# Patient Record
Sex: Male | Born: 1935 | Race: Black or African American | Hispanic: No | Marital: Single | Smoking: Never smoker
Health system: Southern US, Community
[De-identification: ages and names within clinical notes are randomized; demographics above are authoritative.]

## PROBLEM LIST (undated history)

## (undated) DIAGNOSIS — R569 Unspecified convulsions: Secondary | ICD-10-CM

## (undated) DIAGNOSIS — I639 Cerebral infarction, unspecified: Secondary | ICD-10-CM

## (undated) DIAGNOSIS — F0280 Dementia in other diseases classified elsewhere without behavioral disturbance: Secondary | ICD-10-CM

## (undated) DIAGNOSIS — N183 Chronic kidney disease, stage 3 unspecified: Secondary | ICD-10-CM

## (undated) DIAGNOSIS — N400 Enlarged prostate without lower urinary tract symptoms: Secondary | ICD-10-CM

## (undated) DIAGNOSIS — S0990XS Unspecified injury of head, sequela: Secondary | ICD-10-CM

## (undated) DIAGNOSIS — S0193XA Puncture wound without foreign body of unspecified part of head, initial encounter: Secondary | ICD-10-CM

## (undated) DIAGNOSIS — I10 Essential (primary) hypertension: Secondary | ICD-10-CM

## (undated) DIAGNOSIS — W3400XA Accidental discharge from unspecified firearms or gun, initial encounter: Secondary | ICD-10-CM

## (undated) DIAGNOSIS — D638 Anemia in other chronic diseases classified elsewhere: Secondary | ICD-10-CM

## (undated) HISTORY — DX: Cerebral infarction, unspecified: I63.9

## (undated) HISTORY — PX: NO PAST SURGERIES: SHX2092

## (undated) HISTORY — DX: Essential (primary) hypertension: I10

---

## 2005-06-23 ENCOUNTER — Ambulatory Visit (HOSPITAL_COMMUNITY): Admission: RE | Admit: 2005-06-23 | Discharge: 2005-06-23 | Payer: Self-pay | Admitting: Gastroenterology

## 2005-06-23 ENCOUNTER — Encounter (INDEPENDENT_AMBULATORY_CARE_PROVIDER_SITE_OTHER): Payer: Self-pay | Admitting: *Deleted

## 2007-06-17 ENCOUNTER — Emergency Department (HOSPITAL_COMMUNITY): Admission: EM | Admit: 2007-06-17 | Discharge: 2007-06-17 | Payer: Self-pay | Admitting: Emergency Medicine

## 2008-10-06 ENCOUNTER — Emergency Department (HOSPITAL_COMMUNITY): Admission: EM | Admit: 2008-10-06 | Discharge: 2008-10-06 | Payer: Self-pay | Admitting: Emergency Medicine

## 2008-11-18 ENCOUNTER — Encounter: Admission: RE | Admit: 2008-11-18 | Discharge: 2008-11-18 | Payer: Self-pay | Admitting: Gastroenterology

## 2011-09-06 LAB — URINALYSIS, ROUTINE W REFLEX MICROSCOPIC
Glucose, UA: NEGATIVE
Hgb urine dipstick: NEGATIVE
Protein, ur: NEGATIVE
Specific Gravity, Urine: 1.02
pH: 5.5

## 2011-09-06 LAB — COMPREHENSIVE METABOLIC PANEL
ALT: 9
BUN: 43 — ABNORMAL HIGH
CO2: 28
Creatinine, Ser: 1.96 — ABNORMAL HIGH
Sodium: 138

## 2011-09-06 LAB — DIFFERENTIAL
Basophils Absolute: 0
Eosinophils Absolute: 0.1
Lymphs Abs: 2
Monocytes Absolute: 0.6
Monocytes Relative: 11
Neutro Abs: 2.6
Neutrophils Relative %: 49

## 2011-09-06 LAB — CBC
HCT: 35.5 — ABNORMAL LOW
MCHC: 32.9
MCV: 85.2
RDW: 16.5 — ABNORMAL HIGH

## 2011-09-20 LAB — DIFFERENTIAL
Basophils Absolute: 0.1
Basophils Relative: 1
Eosinophils Absolute: 0.1
Eosinophils Relative: 1
Lymphocytes Relative: 44
Lymphs Abs: 2.4
Monocytes Relative: 8
Neutro Abs: 2.4
Neutrophils Relative %: 45

## 2011-09-20 LAB — CBC
Platelets: 166
WBC: 5.5

## 2011-09-20 LAB — BASIC METABOLIC PANEL
Chloride: 104
Sodium: 139

## 2012-01-08 ENCOUNTER — Ambulatory Visit: Payer: Medicare Other

## 2012-01-08 ENCOUNTER — Ambulatory Visit (INDEPENDENT_AMBULATORY_CARE_PROVIDER_SITE_OTHER): Payer: Medicare Other | Admitting: Family Medicine

## 2012-01-08 DIAGNOSIS — R102 Pelvic and perineal pain: Secondary | ICD-10-CM

## 2012-01-08 DIAGNOSIS — N390 Urinary tract infection, site not specified: Secondary | ICD-10-CM

## 2012-01-08 DIAGNOSIS — R509 Fever, unspecified: Secondary | ICD-10-CM

## 2012-01-08 DIAGNOSIS — R109 Unspecified abdominal pain: Secondary | ICD-10-CM

## 2012-01-08 DIAGNOSIS — I10 Essential (primary) hypertension: Secondary | ICD-10-CM | POA: Insufficient documentation

## 2012-01-08 DIAGNOSIS — F0390 Unspecified dementia without behavioral disturbance: Secondary | ICD-10-CM

## 2012-01-08 DIAGNOSIS — R32 Unspecified urinary incontinence: Secondary | ICD-10-CM

## 2012-01-08 LAB — POCT CBC
HCT, POC: 39.3 % — AB (ref 43.5–53.7)
Lymph, poc: 1.9 (ref 0.6–3.4)
MCH, POC: 26.3 pg — AB (ref 27–31.2)
MCHC: 31.6 g/dL — AB (ref 31.8–35.4)
MCV: 83.4 fL (ref 80–97)
MID (cbc): 1.3 — AB (ref 0–0.9)
POC LYMPH PERCENT: 11.4 %L (ref 10–50)
POC MID %: 7.4 %M (ref 0–12)
Platelet Count, POC: 163 10*3/uL (ref 142–424)
RBC: 4.71 M/uL (ref 4.69–6.13)

## 2012-01-08 MED ORDER — CIPROFLOXACIN HCL 500 MG PO TABS: 500.0000 mg | ORAL_TABLET | Freq: Two times a day (BID) | ORAL | Status: AC

## 2012-01-08 NOTE — Progress Notes (Signed)
  Subjective:    Patient ID: Ronnie Hood, male    DOB: [birth year], 78 y.o.   MRN: [id number]  HPI 78 yo with multiple medical problems including dementia with  Constipation (per caregiver who is not here now), duration unknown (as caretaker unavailable).  SHaking this morning.  C/O abdominal pain for about a week, today actually wanted to come to the doctor.  Appetite seems unchanged (minimal at baseline).  Unknown if fever this week.  No nausea or vomitting.  Unknown if urine decreased.  Patient denies dysuria or  Pain elsewhere other than stomach. Endorses  Dry cough.  No runny nose, sore throat.  No history of abdominal surgeries.   Patient is incontinent - wears adult diapers  Review of Systems Negative except per HPI - no more obtainable due to patient's dementia.     Objective:   Physical Exam  Constitutional: He appears well-developed and well-nourished.  Cardiovascular: Normal rate, regular rhythm, normal heart sounds and intact distal pulses.   Pulmonary/Chest: Effort normal and breath sounds normal.  Abdominal: Soft. Bowel sounds are normal. He exhibits distension. There is no tenderness. There is no rebound and no guarding.  Neurological: He is alert.    No abdominal pain on exam, but endorsed suprapubic region as location of pain when it does hurt.   [facility] reading (PRIMARY) by  Dr. Georgiana Shore:  NAD      Results for orders placed in visit on 01/08/12  POCT CBC      Component Value Range   WBC 16.9 (*) 4.6 - 10.2 (K/uL)   Lymph, poc 1.9  0.6 - 3.4    POC LYMPH PERCENT 11.4  10 - 50 (%L)   MID (cbc) 1.3 (*) 0 - 0.9    POC MID % 7.4  0 - 12 (%M)   POC Granulocyte 13.7 (*) 2 - 6.9    Granulocyte percent 81.2 (*) 37 - 80 (%G)   RBC 4.71  4.69 - 6.13 (M/uL)   Hemoglobin 12.4 (*) 14.1 - 18.1 (g/dL)   HCT, POC 40.9 (*) 81.1 - 53.7 (%)   MCV 83.4  80 - 97 (fL)   MCH, POC 26.3 (*) 27 - 31.2 (pg)   MCHC 31.6 (*) 31.8 - 35.4 (g/dL)   RDW, POC 91.4     Platelet Count, POC  163  142 - 424 (K/uL)   MPV 9.9  0 - 99.8 (fL)    Assessment & Plan:  Abd pain, low grade fevers, leukocytosis - suspect UTI.  Unable to obtain sample.  Xray normal.  Cipro 500 BID for 10 days.  If not improving in 48 hours, RTC to see me Tuesday.  If doing better, return prn.

## 2012-09-06 ENCOUNTER — Ambulatory Visit (INDEPENDENT_AMBULATORY_CARE_PROVIDER_SITE_OTHER): Payer: Medicare Other | Admitting: *Deleted

## 2012-09-06 DIAGNOSIS — Z23 Encounter for immunization: Secondary | ICD-10-CM

## 2012-10-10 ENCOUNTER — Ambulatory Visit (INDEPENDENT_AMBULATORY_CARE_PROVIDER_SITE_OTHER): Payer: Medicare Other | Admitting: Family Medicine

## 2012-10-10 ENCOUNTER — Encounter: Payer: Self-pay | Admitting: Family Medicine

## 2012-10-10 VITALS — BP 169/92 | HR 67 | Temp 98.0°F | Resp 16 | Wt 169.4 lb

## 2012-10-10 DIAGNOSIS — Z9109 Other allergy status, other than to drugs and biological substances: Secondary | ICD-10-CM

## 2012-10-10 DIAGNOSIS — J309 Allergic rhinitis, unspecified: Secondary | ICD-10-CM

## 2012-10-10 DIAGNOSIS — I10 Essential (primary) hypertension: Secondary | ICD-10-CM

## 2012-10-10 MED ORDER — TRIAMTERENE-HCTZ 37.5-25 MG PO TABS: 1.0000 | ORAL_TABLET | Freq: Every day | ORAL | Status: DC

## 2012-10-10 MED ORDER — MOMETASONE FUROATE 50 MCG/ACT NA SUSP: 2.0000 | Freq: Every day | NASAL | Status: AC

## 2012-10-10 NOTE — Patient Instructions (Addendum)
Return to clinic in one month for recheck on blood pressure

## 2012-10-10 NOTE — Progress Notes (Signed)
  Subjective:    Patient ID: Ronnie Hood, male    DOB: 1926-06-30, 78 y.o.   MRN: [id number]  HPI patient presents with complaints of increased blood pressure recently. Denies dizziness, chest pain shortness of breath. Has some occasional swelling of right lower extremity s/p CVA, this is the side effected. He goes to the [facility] for his primary care.  Patient also has complaints of nasal congestion.  Veteran.  Moved from [place] to be with family after stroke several years ago  Review of Systems   Patient states has history of 3 previous CVA's, at 78 years of age, reports no recent health problems, healthy appearing male looks younger than stated age. Current outpatient prescriptions:amLODipine (NORVASC) 10 MG tablet, Take 10 mg by mouth daily., Disp: , Rfl: ;  aspirin 325 MG EC tablet, Take 325 mg by mouth daily., Disp: , Rfl: ;  divalproex (DEPAKOTE ER) 500 MG 24 hr tablet, Take 500 mg by mouth daily., Disp: , Rfl: ;  ergocalciferol (VITAMIN D2) 50000 UNITS capsule, Take 50,000 Units by mouth once a week., Disp: , Rfl:  ferrous sulfate 325 (65 FE) MG tablet, Take 325 mg by mouth 2 (two) times daily with a meal., Disp: , Rfl: ;  Fesoterodine Fumarate (TOVIAZ) 8 MG TB24, Take 8 mg by mouth daily., Disp: , Rfl: ;  galantamine (RAZADYNE ER) 16 MG 24 hr capsule, Take 16 mg by mouth daily with breakfast., Disp: , Rfl: ;  lisinopril (PRINIVIL,ZESTRIL) 40 MG tablet, Take 40 mg by mouth daily., Disp: , Rfl:  Multiple Vitamin (MULTIVITAMIN) tablet, Take 1 tablet by mouth daily., Disp: , Rfl: ;  omeprazole (PRILOSEC) 20 MG capsule, Take 20 mg by mouth daily., Disp: , Rfl:  Objective:   Physical Exam Chest clear heart regular rate and rhythm no murmur no gallop, trace edema in right lower leg. Slightly weak grip strength right upper extremity. No right arm drift on exam, no bruit. Alert and oriented x3 seen with sister. Nasal passages swollen, pale Skin:  No rash Using walker on wheels Patient very  appropriate and oriented    Assessment & Plan:  Return in 1 month for recheck of blood pressure Use Nasonex  For nasal passages. Meds ordered this encounter  Medications  . triamterene-hydrochlorothiazide (MAXZIDE-25) 37.5-25 MG per tablet    Sig: Take 1 each (1 tablet total) by mouth daily.    Dispense:  30 tablet    Refill:  3    Needs follow up visit 12/13  . mometasone (NASONEX) 50 MCG/ACT nasal spray    Sig: Place 2 sprays into the nose daily.    Dispense:  17 g    Refill:  12

## 2012-11-05 ENCOUNTER — Ambulatory Visit (INDEPENDENT_AMBULATORY_CARE_PROVIDER_SITE_OTHER): Payer: Medicare Other | Admitting: Family Medicine

## 2012-11-05 VITALS — BP 125/65 | HR 88 | Temp 98.7°F | Resp 18 | Wt 161.0 lb

## 2012-11-05 DIAGNOSIS — I10 Essential (primary) hypertension: Secondary | ICD-10-CM

## 2012-11-05 DIAGNOSIS — Z5181 Encounter for therapeutic drug level monitoring: Secondary | ICD-10-CM

## 2012-11-05 NOTE — Patient Instructions (Addendum)
We would like Ronnie Hood's BP to be  115- 140/ 70- 95.    Today his "bottom number" is a bit lower than we need it to be.  If you notice he is running lower than 115/70 consistently we can split the triamterene/ hydrochlorothiazide medication and have him take just a 1/2 tablet daily   I will be in touch regarding Ronnie Hood's labs.  In the meantime we can continue his medications.

## 2012-11-05 NOTE — Progress Notes (Signed)
[facility] [address], [place] [address] [id number]  Date:  11/05/2012   Name:  [name]   DOB:  08-19-1926   MRN:  [id number]  PCP:  No primary provider on file.    Chief Complaint: med consult   History of Present Illness:  Ronnie Hood is a 77 y.o. very pleasant male patient who presents with the following:  He was here about a month ago to evaluate increasing BP.  Dr. Milus Glazier added maxzide to his regimen last month and he reports that his BP has been very well controlled since.   They have not noted any adverse effects such as dizziness.   He is followed by the [facility] and they check his BP at home.  His RLE swelling is also better.    Otherwise Ronnie Hood is doing well today.  They are here to make sure if current BP is ok, and also to find out if the maxzide was meant to be a permanent addition to his regimen Patient Active Problem List  Diagnosis  . Hypertension  . Allergic rhinitis    Past Medical History  Diagnosis Date  . Hypertension   . Stroke     No past surgical history on file.  History  Substance Use Topics  . Smoking status: Never Smoker   . Smokeless tobacco: Not on file  . Alcohol Use: No    No family history on file.  Allergies  Allergen Reactions  . Penicillins Rash  . Sulfa Antibiotics Rash  . Codeine Nausea Only and Other (See Comments)    Headache    Medication list has been reviewed and updated.  Current Outpatient Prescriptions on File Prior to Visit  Medication Sig Dispense Refill  . amLODipine (NORVASC) 10 MG tablet Take 10 mg by mouth daily.      Marland Kitchen aspirin 325 MG EC tablet Take 325 mg by mouth daily.      . divalproex (DEPAKOTE ER) 500 MG 24 hr tablet Take 500 mg by mouth daily.      . ergocalciferol (VITAMIN D2) 50000 UNITS capsule Take 50,000 Units by mouth once a week.      . ferrous sulfate 325 (65 FE) MG tablet Take 325 mg by mouth 2 (two) times daily with a meal.      . Fesoterodine  Fumarate (TOVIAZ) 8 MG TB24 Take 8 mg by mouth daily.      Marland Kitchen lisinopril (PRINIVIL,ZESTRIL) 40 MG tablet Take 40 mg by mouth daily.      . mometasone (NASONEX) 50 MCG/ACT nasal spray Place 2 sprays into the nose daily.  17 g  12  . Multiple Vitamin (MULTIVITAMIN) tablet Take 1 tablet by mouth daily.      Marland Kitchen omeprazole (PRILOSEC) 20 MG capsule Take 20 mg by mouth daily.      Marland Kitchen triamterene-hydrochlorothiazide (MAXZIDE-25) 37.5-25 MG per tablet Take 1 each (1 tablet total) by mouth daily.  30 tablet  3  . galantamine (RAZADYNE ER) 16 MG 24 hr capsule Take 16 mg by mouth daily with breakfast.        Review of Systems:  As per HPI- otherwise negative.   Physical Examination: Filed Vitals:   11/05/12 1140  BP: 125/65  Pulse: 88  Temp: 98.7 F (37.1 C)  Resp: 18   Filed Vitals:   11/05/12 1140  Weight: 161 lb (73.029 kg)   There is no height on file to calculate BMI. Ideal Body Weight:  GEN: WDWN, NAD, Non-toxic, A & O x 3- appears younger than stated age HEENT: Atraumatic, Normocephalic. Neck supple. No masses, No LAD. Ears and Nose: No external deformity. CV: RRR, No M/G/R. No JVD. No thrill. No extra heart sounds. PULM: CTA B, no wheezes, crackles, rhonchi. No retractions. No resp. distress. No accessory muscle use. ABD: S, NT, ND, +BS. No rebound. No HSM. EXTR: No c/c/e- no LE swelling present NEURO uses a walker due to history of CVA PSYCH: Normally interactive. Conversant. Not depressed or anxious appearing.  Calm demeanor.    Assessment and Plan: 1. HTN (hypertension)    2. Medication monitoring encounter  Basic metabolic panel   BP controlled with current medications.  Need to make sure he does not get too low- discussed with his son who is present in the room today.  Gave BP parameters.  If he is running too low can split his maxzide pill in half.   Check BMP today- Will plan further follow- up pending labs.   Abbe Amsterdam, MD

## 2012-11-06 ENCOUNTER — Encounter: Payer: Self-pay | Admitting: Family Medicine

## 2012-11-06 LAB — BASIC METABOLIC PANEL
Creat: 1.64 mg/dL — ABNORMAL HIGH (ref 0.50–1.35)
Sodium: 136 mEq/L (ref 135–145)

## 2012-11-06 NOTE — Addendum Note (Signed)
Addended by: Abbe Amsterdam C on: 11/06/2012 03:47 AM   Modules accepted: Orders

## 2013-04-07 ENCOUNTER — Other Ambulatory Visit: Payer: Self-pay | Admitting: Family Medicine

## 2013-09-30 ENCOUNTER — Ambulatory Visit (INDEPENDENT_AMBULATORY_CARE_PROVIDER_SITE_OTHER): Payer: Medicare Other | Admitting: Family Medicine

## 2013-09-30 VITALS — BP 110/58 | HR 64 | Temp 97.9°F | Resp 17 | Wt 164.0 lb

## 2013-09-30 DIAGNOSIS — I10 Essential (primary) hypertension: Secondary | ICD-10-CM

## 2013-09-30 DIAGNOSIS — N189 Chronic kidney disease, unspecified: Secondary | ICD-10-CM

## 2013-09-30 DIAGNOSIS — Z23 Encounter for immunization: Secondary | ICD-10-CM

## 2013-09-30 DIAGNOSIS — Z862 Personal history of diseases of the blood and blood-forming organs and certain disorders involving the immune mechanism: Secondary | ICD-10-CM

## 2013-09-30 DIAGNOSIS — N400 Enlarged prostate without lower urinary tract symptoms: Secondary | ICD-10-CM

## 2013-09-30 LAB — POCT CBC
Granulocyte percent: 53.5 %G (ref 37–80)
Hemoglobin: 11.8 g/dL — AB (ref 14.1–18.1)
MCH, POC: 27.5 pg (ref 27–31.2)
MCHC: 31 g/dL — AB (ref 31.8–35.4)
MID (cbc): 0.3 (ref 0–0.9)
POC LYMPH PERCENT: 38.9 %L (ref 10–50)
POC MID %: 7.6 %M (ref 0–12)
Platelet Count, POC: 159 10*3/uL (ref 142–424)
RBC: 4.29 M/uL — AB (ref 4.69–6.13)

## 2013-09-30 LAB — COMPREHENSIVE METABOLIC PANEL
ALT: 9 U/L (ref 0–53)
Albumin: 3.8 g/dL (ref 3.5–5.2)
BUN: 38 mg/dL — ABNORMAL HIGH (ref 6–23)
Chloride: 108 mEq/L (ref 96–112)
Glucose, Bld: 103 mg/dL — ABNORMAL HIGH (ref 70–99)
Potassium: 5.3 mEq/L (ref 3.5–5.3)
Total Bilirubin: 0.4 mg/dL (ref 0.3–1.2)

## 2013-09-30 NOTE — Patient Instructions (Signed)
Continue to take the same medications  Ask the [facility] hospital about getting cataracts done   Return in 6 months, sooner if problems  Make sure you drink plenty of water to keep kidneys flushed out well.

## 2013-09-30 NOTE — Progress Notes (Signed)
Subjective: 79 year old man who has been getting his medications regularly from the [facility]. he is here for a checkup. I have not seen him for a long time. He continues to live in an apartment with his sister. He has a relative who is his caregiver also. He ambulates with a walker. He can no longer walk with a cane. He has a history of old strokes. He has problems with poor vision from his right eye, has been told he has a cataract there. However they have not told him to get cataract surgery yet. It does bother him however. He is on a number of medications which he takes faithfully. He does have to get up and urinate frequently. Otherwise brief review of systems was unremarkable.  Objective: Well-developed well-nourished man in no acute distress. He is hard of hearing, however TMs are normal. Throat is clear, missing a lot of teeth. Neck was supple without nodes or thyromegaly. No carotid bruits. Chest is clear to auscultation. Heart regular without murmurs. No ankle edema.  Assessment: Hypertension, good control,  late effects of CVA Cataract History of BPH History of anemia, probably related to his chronic chronic renal insufficiency History of chronic renal insufficiency  Plan: Check a CBC and blood chemistries panel. Continue same medications. No prescriptions needed since he gets his medicines from the [place]. Advise that we recheck him in about 6 months, sooner if he has problems   Results for orders placed in visit on 09/30/13  POCT CBC      Result Value Range   WBC 4.4 (*) 4.6 - 10.2 K/uL   Lymph, poc 1.7  0.6 - 3.4   POC LYMPH PERCENT 38.9  10 - 50 %L   MID (cbc) 0.3  0 - 0.9   POC MID % 7.6  0 - 12 %M   POC Granulocyte 2.4  2 - 6.9   Granulocyte percent 53.5  37 - 80 %G   RBC 4.29 (*) 4.69 - 6.13 M/uL   Hemoglobin 11.8 (*) 14.1 - 18.1 g/dL   HCT, POC 16.1 (*) 09.6 - 53.7 %   MCV 88.8  80 - 97 fL   MCH, POC 27.5  27 - 31.2 pg   MCHC 31.0 (*) 31.8 - 35.4 g/dL   RDW,  POC 04.5     Platelet Count, POC 159  142 - 424 K/uL   MPV 10.1  0 - 99.8 fL

## 2013-10-03 ENCOUNTER — Encounter: Payer: Self-pay | Admitting: Family Medicine

## 2014-05-08 ENCOUNTER — Ambulatory Visit (INDEPENDENT_AMBULATORY_CARE_PROVIDER_SITE_OTHER): Payer: Medicare Other | Admitting: Family Medicine

## 2014-05-08 VITALS — BP 122/72 | HR 69 | Temp 97.1°F | Resp 18 | Ht 68.0 in | Wt 168.2 lb

## 2014-05-08 DIAGNOSIS — D638 Anemia in other chronic diseases classified elsewhere: Secondary | ICD-10-CM

## 2014-05-08 DIAGNOSIS — T50905A Adverse effect of unspecified drugs, medicaments and biological substances, initial encounter: Secondary | ICD-10-CM

## 2014-05-08 DIAGNOSIS — R609 Edema, unspecified: Secondary | ICD-10-CM

## 2014-05-08 DIAGNOSIS — T887XXA Unspecified adverse effect of drug or medicament, initial encounter: Secondary | ICD-10-CM

## 2014-05-08 DIAGNOSIS — N289 Disorder of kidney and ureter, unspecified: Secondary | ICD-10-CM

## 2014-05-08 LAB — POCT CBC
GRANULOCYTE PERCENT: 52.4 % (ref 37–80)
HCT, POC: 37.3 % — AB (ref 43.5–53.7)
Hemoglobin: 11.6 g/dL — AB (ref 14.1–18.1)
Lymph, poc: 1.8 (ref 0.6–3.4)
MCH, POC: 27.1 pg (ref 27–31.2)
MCHC: 31.1 g/dL — AB (ref 31.8–35.4)
MCV: 87.2 fL (ref 80–97)
MID (CBC): 0.3 (ref 0–0.9)
MPV: 9.4 fL (ref 0–99.8)
PLATELET COUNT, POC: 205 10*3/uL (ref 142–424)
POC GRANULOCYTE: 2.4 (ref 2–6.9)
POC LYMPH PERCENT: 39.9 %L (ref 10–50)
POC MID %: 7.7 % (ref 0–12)
RBC: 4.28 M/uL — AB (ref 4.69–6.13)
RDW, POC: 17.8 %
WBC: 4.5 10*3/uL — AB (ref 4.6–10.2)

## 2014-05-08 LAB — POCT URINALYSIS DIPSTICK
Bilirubin, UA: NEGATIVE
Glucose, UA: NEGATIVE
Ketones, UA: NEGATIVE
Leukocytes, UA: NEGATIVE
NITRITE UA: NEGATIVE
PH UA: 6
Protein, UA: NEGATIVE
RBC UA: NEGATIVE
SPEC GRAV UA: 1.015
UROBILINOGEN UA: 1

## 2014-05-08 LAB — POCT UA - MICROSCOPIC ONLY
CASTS, UR, LPF, POC: NEGATIVE
Crystals, Ur, HPF, POC: NEGATIVE
Mucus, UA: NEGATIVE
RBC, urine, microscopic: NEGATIVE
Yeast, UA: NEGATIVE

## 2014-05-08 NOTE — Patient Instructions (Signed)
Decrease the amlodipine to 5 mg (one half of a 10 mg) one daily  Continue to try to avoid salt  When you are just seated keep your feet elevated as much of the time as possible. When you are up and about trying to keep walking.  I will let you know the results of your labs by sometime early next week. In the event that your kidney function has improved a lot I might add another medication, but if things are similar to what they were last time we will leave things the same  When you go back to the [facility], ask if you can be seeing a urologist to check on your prostate gland. Since you're getting up to urinate frequently all night, I believe it may need more attention than just the medicines you are on.  Return in about 3 months, early September, for a recheck. Return sooner if necessary.

## 2014-05-08 NOTE — Progress Notes (Signed)
Subjective: Patient is here with problems with swelling in his feet, right more than left. He goes to the [facility] where he is prescribed most of his medications. No recent medication changes. No injuries to his legs. He does not know how his kidney function is doing. He is 80 years old and quite proud of it. No chest pains or excessive shortness of breath. No GI complaints. He does have very frequent nocturia.  Objective: No JVD. Chest clear. Heart regular without murmurs. He has 2-3+ pitting edema of the right leg, 1-2+ of the left.  Assessment: Edema Nocturia consistent with BPH Hypertension, on Norvasc, possibly a cause of the edema  Plan: Explained to them that the amlodipine can cause swelling especially in hot weather. Try cutting it down to 5 mg daily. Check blood chemistries. Urinalysis was normal today.         Results for orders placed in visit on 05/08/14  POCT CBC      Result Value Ref Range   WBC 4.5 (*) 4.6 - 10.2 K/uL   Lymph, poc 1.8  0.6 - 3.4   POC LYMPH PERCENT 39.9  10 - 50 %L   MID (cbc) 0.3  0 - 0.9   POC MID % 7.7  0 - 12 %M   POC Granulocyte 2.4  2 - 6.9   Granulocyte percent 52.4  37 - 80 %G   RBC 4.28 (*) 4.69 - 6.13 M/uL   Hemoglobin 11.6 (*) 14.1 - 18.1 g/dL   HCT, POC 70.4 (*) 88.8 - 53.7 %   MCV 87.2  80 - 97 fL   MCH, POC 27.1  27 - 31.2 pg   MCHC 31.1 (*) 31.8 - 35.4 g/dL   RDW, POC 91.6     Platelet Count, POC 205  142 - 424 K/uL   MPV 9.4  0 - 99.8 fL  POCT URINALYSIS DIPSTICK      Result Value Ref Range   Color, UA yellow     Clarity, UA clear     Glucose, UA neg     Bilirubin, UA neg     Ketones, UA neg     Spec Grav, UA 1.015     Blood, UA neg     pH, UA 6.0     Protein, UA neg     Urobilinogen, UA 1.0     Nitrite, UA neg     Leukocytes, UA Negative    POCT UA - MICROSCOPIC ONLY      Result Value Ref Range   WBC, Ur, HPF, POC 0-1     RBC, urine, microscopic neg     Bacteria, U Microscopic trace     Mucus, UA neg     Epithelial cells, urine per micros 1-3     Crystals, Ur, HPF, POC neg     Casts, Ur, LPF, POC neg     Yeast, UA neg

## 2014-05-09 LAB — COMPREHENSIVE METABOLIC PANEL
ALBUMIN: 3.7 g/dL (ref 3.5–5.2)
ALK PHOS: 40 U/L (ref 39–117)
AST: 18 U/L (ref 0–37)
BUN: 29 mg/dL — ABNORMAL HIGH (ref 6–23)
CO2: 23 mEq/L (ref 19–32)
Calcium: 9.4 mg/dL (ref 8.4–10.5)
Chloride: 106 mEq/L (ref 96–112)
Creat: 1.56 mg/dL — ABNORMAL HIGH (ref 0.50–1.35)
Glucose, Bld: 99 mg/dL (ref 70–99)
POTASSIUM: 5.1 meq/L (ref 3.5–5.3)
Sodium: 137 mEq/L (ref 135–145)
Total Bilirubin: 0.4 mg/dL (ref 0.2–1.2)
Total Protein: 7.1 g/dL (ref 6.0–8.3)

## 2014-05-10 ENCOUNTER — Encounter: Payer: Self-pay | Admitting: Family Medicine

## 2014-09-12 ENCOUNTER — Ambulatory Visit (INDEPENDENT_AMBULATORY_CARE_PROVIDER_SITE_OTHER): Payer: Medicare Other | Admitting: *Deleted

## 2014-09-12 DIAGNOSIS — Z23 Encounter for immunization: Secondary | ICD-10-CM

## 2014-10-20 ENCOUNTER — Encounter (HOSPITAL_COMMUNITY): Payer: Self-pay | Admitting: Emergency Medicine

## 2014-10-20 ENCOUNTER — Inpatient Hospital Stay (HOSPITAL_COMMUNITY)
Admission: EM | Admit: 2014-10-20 | Discharge: 2014-10-22 | DRG: 690 | Disposition: A | Discharge status: Home Health | Payer: Medicare Other | Attending: Internal Medicine | Admitting: Internal Medicine

## 2014-10-20 DIAGNOSIS — D72829 Elevated white blood cell count, unspecified: Secondary | ICD-10-CM | POA: Diagnosis present

## 2014-10-20 DIAGNOSIS — Z885 Allergy status to narcotic agent status: Secondary | ICD-10-CM | POA: Diagnosis not present

## 2014-10-20 DIAGNOSIS — Z880 Allergy status to penicillin: Secondary | ICD-10-CM | POA: Diagnosis not present

## 2014-10-20 DIAGNOSIS — D638 Anemia in other chronic diseases classified elsewhere: Secondary | ICD-10-CM | POA: Diagnosis present

## 2014-10-20 DIAGNOSIS — R262 Difficulty in walking, not elsewhere classified: Secondary | ICD-10-CM

## 2014-10-20 DIAGNOSIS — Z882 Allergy status to sulfonamides status: Secondary | ICD-10-CM | POA: Diagnosis not present

## 2014-10-20 DIAGNOSIS — F0390 Unspecified dementia without behavioral disturbance: Secondary | ICD-10-CM | POA: Diagnosis present

## 2014-10-20 DIAGNOSIS — I10 Essential (primary) hypertension: Secondary | ICD-10-CM | POA: Diagnosis present

## 2014-10-20 DIAGNOSIS — Z8673 Personal history of transient ischemic attack (TIA), and cerebral infarction without residual deficits: Secondary | ICD-10-CM | POA: Diagnosis not present

## 2014-10-20 DIAGNOSIS — D696 Thrombocytopenia, unspecified: Secondary | ICD-10-CM | POA: Diagnosis present

## 2014-10-20 DIAGNOSIS — N390 Urinary tract infection, site not specified: Principal | ICD-10-CM | POA: Diagnosis present

## 2014-10-20 DIAGNOSIS — N183 Chronic kidney disease, stage 3 unspecified: Secondary | ICD-10-CM | POA: Diagnosis present

## 2014-10-20 DIAGNOSIS — N400 Enlarged prostate without lower urinary tract symptoms: Secondary | ICD-10-CM | POA: Diagnosis present

## 2014-10-20 DIAGNOSIS — Z7982 Long term (current) use of aspirin: Secondary | ICD-10-CM

## 2014-10-20 DIAGNOSIS — R531 Weakness: Secondary | ICD-10-CM

## 2014-10-20 DIAGNOSIS — I129 Hypertensive chronic kidney disease with stage 1 through stage 4 chronic kidney disease, or unspecified chronic kidney disease: Secondary | ICD-10-CM | POA: Diagnosis present

## 2014-10-20 DIAGNOSIS — J309 Allergic rhinitis, unspecified: Secondary | ICD-10-CM | POA: Diagnosis present

## 2014-10-20 LAB — BASIC METABOLIC PANEL
ANION GAP: 14 (ref 5–15)
BUN: 43 mg/dL — ABNORMAL HIGH (ref 6–23)
CO2: 20 meq/L (ref 19–32)
CREATININE: 1.82 mg/dL — AB (ref 0.50–1.35)
Calcium: 9.3 mg/dL (ref 8.4–10.5)
Chloride: 100 mEq/L (ref 96–112)
GFR calc Af Amer: 37 mL/min — ABNORMAL LOW (ref >90)
GFR, EST NON AFRICAN AMERICAN: 32 mL/min — AB (ref >90)
Glucose, Bld: 118 mg/dL — ABNORMAL HIGH (ref 70–99)
Potassium: 4.3 mEq/L (ref 3.7–5.3)
SODIUM: 134 meq/L — AB (ref 137–147)

## 2014-10-20 LAB — CBC
HCT: 29.1 % — ABNORMAL LOW (ref 39.0–52.0)
Hemoglobin: 9.6 g/dL — ABNORMAL LOW (ref 13.0–17.0)
MCH: 27.4 pg (ref 26.0–34.0)
MCHC: 33 g/dL (ref 30.0–36.0)
MCV: 82.9 fL (ref 78.0–100.0)
PLATELETS: 132 10*3/uL — AB (ref 150–400)
RBC: 3.51 MIL/uL — AB (ref 4.22–5.81)
RDW: 15.9 % — ABNORMAL HIGH (ref 11.5–15.5)
WBC: 14.9 10*3/uL — ABNORMAL HIGH (ref 4.0–10.5)

## 2014-10-20 LAB — URINE MICROSCOPIC-ADD ON

## 2014-10-20 LAB — URINALYSIS, ROUTINE W REFLEX MICROSCOPIC
BILIRUBIN URINE: NEGATIVE
Glucose, UA: NEGATIVE mg/dL
KETONES UR: NEGATIVE mg/dL
Nitrite: POSITIVE — AB
PROTEIN: NEGATIVE mg/dL
Specific Gravity, Urine: 1.019 (ref 1.005–1.030)
UROBILINOGEN UA: 1 mg/dL (ref 0.0–1.0)
pH: 5.5 (ref 5.0–8.0)

## 2014-10-20 LAB — I-STAT TROPONIN, ED: TROPONIN I, POC: 0 ng/mL (ref 0.00–0.08)

## 2014-10-20 LAB — VALPROIC ACID LEVEL: Valproic Acid Lvl: 51.3 ug/mL (ref 50.0–100.0)

## 2014-10-20 MED ORDER — ACETAMINOPHEN 325 MG PO TABS: 650.0000 mg | ORAL_TABLET | Freq: Four times a day (QID) | ORAL | Status: DC | PRN

## 2014-10-20 MED ORDER — TERAZOSIN HCL 2 MG PO CAPS
2.0000 mg | ORAL_CAPSULE | Freq: Every day | ORAL | Status: DC
  Administered 2014-10-20 – 2014-10-21 (×2): 2 mg via ORAL
  Filled 2014-10-20 (×3): qty 1

## 2014-10-20 MED ORDER — ONDANSETRON HCL 4 MG PO TABS: 4.0000 mg | ORAL_TABLET | Freq: Four times a day (QID) | ORAL | Status: DC | PRN

## 2014-10-20 MED ORDER — LORATADINE 10 MG PO TABS
10.0000 mg | ORAL_TABLET | Freq: Every day | ORAL | Status: DC
  Administered 2014-10-20 – 2014-10-22 (×3): 10 mg via ORAL
  Filled 2014-10-20 (×3): qty 1

## 2014-10-20 MED ORDER — PANTOPRAZOLE SODIUM 40 MG PO TBEC
40.0000 mg | DELAYED_RELEASE_TABLET | Freq: Every day | ORAL | Status: DC
  Administered 2014-10-20 – 2014-10-22 (×3): 40 mg via ORAL
  Filled 2014-10-20 (×3): qty 1

## 2014-10-20 MED ORDER — ACETAMINOPHEN 650 MG RE SUPP: 650.0000 mg | Freq: Four times a day (QID) | RECTAL | Status: DC | PRN

## 2014-10-20 MED ORDER — LISINOPRIL 40 MG PO TABS
40.0000 mg | ORAL_TABLET | Freq: Every day | ORAL | Status: DC
  Administered 2014-10-20 – 2014-10-21 (×2): 40 mg via ORAL
  Filled 2014-10-20 (×4): qty 1

## 2014-10-20 MED ORDER — FERROUS SULFATE 325 (65 FE) MG PO TABS
325.0000 mg | ORAL_TABLET | Freq: Two times a day (BID) | ORAL | Status: DC
  Administered 2014-10-20 – 2014-10-22 (×4): 325 mg via ORAL
  Filled 2014-10-20 (×6): qty 1

## 2014-10-20 MED ORDER — LEVOFLOXACIN IN D5W 750 MG/150ML IV SOLN
750.0000 mg | INTRAVENOUS | Status: DC
  Filled 2014-10-20: qty 150

## 2014-10-20 MED ORDER — ONE-DAILY MULTI VITAMINS PO TABS: 1.0000 | ORAL_TABLET | Freq: Every day | ORAL | Status: DC

## 2014-10-20 MED ORDER — LEVOFLOXACIN IN D5W 750 MG/150ML IV SOLN
750.0000 mg | Freq: Once | INTRAVENOUS | Status: AC
  Administered 2014-10-20: 750 mg via INTRAVENOUS
  Filled 2014-10-20: qty 150

## 2014-10-20 MED ORDER — DIVALPROEX SODIUM ER 500 MG PO TB24
500.0000 mg | ORAL_TABLET | Freq: Every day | ORAL | Status: DC
Start: 2014-10-21 — End: 2014-10-22
  Administered 2014-10-21 – 2014-10-22 (×2): 500 mg via ORAL
  Filled 2014-10-20 (×3): qty 1

## 2014-10-20 MED ORDER — SODIUM CHLORIDE 0.9 % IV SOLN
INTRAVENOUS | Status: DC
  Administered 2014-10-20: 16:00:00 via INTRAVENOUS

## 2014-10-20 MED ORDER — GALANTAMINE HYDROBROMIDE ER 8 MG PO CP24
16.0000 mg | ORAL_CAPSULE | Freq: Every day | ORAL | Status: DC
  Filled 2014-10-20: qty 2

## 2014-10-20 MED ORDER — AMLODIPINE BESYLATE 10 MG PO TABS
10.0000 mg | ORAL_TABLET | Freq: Every day | ORAL | Status: DC
  Administered 2014-10-20 – 2014-10-21 (×2): 10 mg via ORAL
  Filled 2014-10-20 (×3): qty 1

## 2014-10-20 MED ORDER — ONDANSETRON HCL 4 MG/2ML IJ SOLN: 4.0000 mg | Freq: Four times a day (QID) | INTRAMUSCULAR | Status: DC | PRN

## 2014-10-20 MED ORDER — SODIUM CHLORIDE 0.9 % IV SOLN
INTRAVENOUS | Status: DC
  Administered 2014-10-21: 10:00:00 via INTRAVENOUS

## 2014-10-20 MED ORDER — ERGOCALCIFEROL 1.25 MG (50000 UT) PO CAPS: 50000.0000 [IU] | ORAL_CAPSULE | ORAL | Status: DC

## 2014-10-20 MED ORDER — ASPIRIN EC 325 MG PO TBEC
325.0000 mg | DELAYED_RELEASE_TABLET | Freq: Every day | ORAL | Status: DC
  Administered 2014-10-21 – 2014-10-22 (×2): 325 mg via ORAL
  Filled 2014-10-20 (×2): qty 1

## 2014-10-20 MED ORDER — FLUTICASONE PROPIONATE 50 MCG/ACT NA SUSP
1.0000 | Freq: Every day | NASAL | Status: DC
  Administered 2014-10-20 – 2014-10-22 (×3): 1 via NASAL
  Filled 2014-10-20: qty 16

## 2014-10-20 MED ORDER — FESOTERODINE FUMARATE ER 8 MG PO TB24: 8.0000 mg | ORAL_TABLET | Freq: Every day | ORAL | Status: DC

## 2014-10-20 NOTE — Progress Notes (Signed)
  CARE MANAGEMENT ED NOTE 10/20/2014  Patient:  Josie SaundersCHAVIS,Sosaia H   Account Number:  [id number]  Date Initiated:  10/20/2014  Documentation initiated by:  Radford PaxFERRERO,Khambrel Amsden  Subjective/Objective Assessment:   Patient presents to Ed with unable to move arms and legs, increased weakness     Subjective/Objective Assessment Detail:   Patient with pmhx of HTN, stroke.  this admission WBX 14.9, Hmg 9.6     Action/Plan:   Action/Plan Detail:   Anticipated DC Date:       Status Recommendation to Physician:   Result of Recommendation:    Other ED Services  Consult Working Plan    DC Planning Services  Other  PCP issues    Choice offered to / List presented to:            Status of service:  Completed, signed off  ED Comments:   ED Comments Detail:  EDCM spoke to patient and his brother at bedside. Patient's brother reports the patient lives at home with his sister Thurston Holenne.  Patient's brother reports the patient has a walker, shower chair and wheelchair at home.  Patient requires assistance  with ADL's per patient's brother. Patient's brother reports the patient usually goes to [facility] [address] for his medical needs.  Patient's brother reports the patient receives assistance at home with ADL's about three days a week but cannot remember the name of the agency. Patient's brother also cannot remember the name of the [facility] patient is associated with.  [facility]'s sister Thurston Holenne [phone] who may have this inofrmation.  EDCM attempted to call patient's sister Thurston Holenne without success.  No further EDCM needs at this time.

## 2014-10-20 NOTE — ED Notes (Signed)
Pt went to bed saturday, states Sunday morning pt couldn't get up or move. Pt states he thought he had a stroke but wife states he was able to move arms and legs. Pt normally able to walk with walker but unable to now.

## 2014-10-20 NOTE — Plan of Care (Signed)
Problem: Phase I Progression Outcomes Goal: Pain controlled with appropriate interventions Outcome: Completed/Met Date Met:  10/20/14     

## 2014-10-20 NOTE — Progress Notes (Signed)
ANTIBIOTIC CONSULT NOTE - INITIAL  Pharmacy Consult for Levaquin Indication: UTI  Allergies  Allergen Reactions  . Penicillins Rash  . Sulfa Antibiotics Rash  . Codeine Nausea Only and Other (See Comments)    Headache    Patient Measurements:    Vital Signs: Temp: 98.2 F (36.8 C) (11/16 1325) Temp Source: Oral (11/16 1325) BP: 149/66 mmHg (11/16 1540) Pulse Rate: 68 (11/16 1540) Intake/Output from previous day:   Intake/Output from this shift:    Labs:  Recent Labs  10/20/14 1412  WBC 14.9*  HGB 9.6*  PLT 132*  CREATININE 1.82*   CrCl cannot be calculated (Unknown ideal weight.). No results for input(s): VANCOTROUGH, VANCOPEAK, VANCORANDOM, GENTTROUGH, GENTPEAK, GENTRANDOM, TOBRATROUGH, TOBRAPEAK, TOBRARND, AMIKACINPEAK, AMIKACINTROU, AMIKACIN in the last 72 hours.   Microbiology: No results found for this or any previous visit (from the past 720 hour(s)).  Medical History: Past Medical History  Diagnosis Date  . Hypertension   . Stroke     Medications:  Scheduled:   Infusions:  . sodium chloride 125 mL/hr at 10/20/14 1623  . levofloxacin (LEVAQUIN) IV 750 mg (10/20/14 1623)   PRN:   Assessment: 80 yo male brought to ED with generalized weakness. Pharmacy is consulted to dose levaquin for presumed UTI.  Goal of Therapy:  Dose appropriate for renal function  Plan:   Levaquin 750mg  IV q48h for CrCl < 50 ml/min Follow up renal function & cultures, clinical course  Loralee PacasErin Maryl Blalock, PharmD, BCPS Pager: [phone] 10/20/2014,4:42 PM

## 2014-10-20 NOTE — ED Provider Notes (Signed)
CSN: [id number]     Arrival date & time 10/20/14  1313 History   First MD Initiated Contact with Patient 10/20/14 1506     Chief Complaint  Patient presents with  . Weakness     (Consider location/radiation/quality/duration/timing/severity/associated sxs/prior Treatment) HPI  The patient's wife reports that 2 days ago he had generalized weakness and had difficulty getting out of bed. She reports that he thought maybe he had had a stroke, however he was able to move his arms and legs and she didn't think he had a stroke. . So she waited until yesterday and the symptoms seemed about the same. He was able to eat however he was shaky and had a lot of difficulty using his walker and getting around at his baseline function. Today the symptoms were no better. There has not been fever, no vomiting, there was loss of control of stool today but she denies that was diarrheal. The patient reports that he just aches all over. She reports as common for him and he does not know how to localize pain. He has not had cough or respiratory difficulty.  Past Medical History  Diagnosis Date  . Hypertension   . Stroke    History reviewed. No pertinent past surgical history. No family history on file. History  Substance Use Topics  . Smoking status: Never Smoker   . Smokeless tobacco: Not on file  . Alcohol Use: No    Review of Systems 10 Systems reviewed and are negative for acute change except as noted in the HPI.    Allergies  Penicillins; Sulfa antibiotics; and Codeine  Home Medications   Prior to Admission medications   Medication Sig Start Date End Date Taking? Authorizing Provider  amLODipine (NORVASC) 10 MG tablet Take 10 mg by mouth daily.    Historical Provider, MD  aspirin 325 MG EC tablet Take 325 mg by mouth daily.    Historical Provider, MD  divalproex (DEPAKOTE ER) 500 MG 24 hr tablet Take 500 mg by mouth daily.    Historical Provider, MD  ergocalciferol (VITAMIN D2) 50000 UNITS  capsule Take 50,000 Units by mouth once a week.    Historical Provider, MD  ferrous sulfate 325 (65 FE) MG tablet Take 325 mg by mouth 2 (two) times daily with a meal.    Historical Provider, MD  Fesoterodine Fumarate (TOVIAZ) 8 MG TB24 Take 8 mg by mouth daily.    Historical Provider, MD  galantamine (RAZADYNE ER) 16 MG 24 hr capsule Take 16 mg by mouth daily with breakfast.    Historical Provider, MD  lisinopril (PRINIVIL,ZESTRIL) 40 MG tablet Take 40 mg by mouth daily.    Historical Provider, MD  loratadine (ALLERGY RELIEF) 10 MG tablet Take 10 mg by mouth daily.    Historical Provider, MD  mometasone (NASONEX) 50 MCG/ACT nasal spray Place 2 sprays into the nose daily. 10/10/12   Elvina Sidle, MD  Multiple Vitamin (MULTIVITAMIN) tablet Take 1 tablet by mouth daily.    Historical Provider, MD  omeprazole (PRILOSEC) 20 MG capsule Take 20 mg by mouth daily.    Historical Provider, MD  terazosin (HYTRIN) 2 MG capsule Take 2 mg by mouth at bedtime.    Historical Provider, MD  triamterene-hydrochlorothiazide (MAXZIDE-25) 37.5-25 MG per tablet TAKE 1 TABLET EVERY DAY 04/07/13   Heather M Marte, PA-C   BP 149/66 mmHg  Pulse 68  Temp(Src) 98.2 F (36.8 C) (Oral)  Resp 19  SpO2 99% Physical Exam  Constitutional: He appears  well-developed and well-nourished.  Patient is nontoxic. He is alert and mildly confused. No respiratory distress  HENT:  Head: Normocephalic and atraumatic.  Nose: Nose normal.  Mouth/Throat: Oropharynx is clear and moist. No oropharyngeal exudate.  Eyes: EOM are normal. Pupils are equal, round, and reactive to light. Right eye exhibits no discharge. Left eye exhibits no discharge.  Neck: Normal range of motion. Neck supple.  Cardiovascular: Normal rate, regular rhythm and intact distal pulses.   Murmur (2/6 systolic ejection murmur) heard. Pulmonary/Chest: Effort normal and breath sounds normal. No respiratory distress. He has no wheezes. He has no rales.  Abdominal: Soft.  Bowel sounds are normal. He exhibits no distension and no mass. There is no tenderness. There is no rebound and no guarding.  Musculoskeletal: He exhibits no edema or tenderness.  Lower extremities are excellent condition. No evidence of cellulitis or edema.  Neurological: He is alert.  Patient is mildly confused, he however response to questions with a clear voice. His wife reports this is a baseline for him. He follows commands to do grip strength and to move his lower extremities. There is no localizing weakness.  Skin: Skin is warm and dry. No rash noted. No erythema.  Psychiatric: He has a normal mood and affect.      ED Course  Procedures (including critical care time) Labs Review Labs Reviewed  CBC - Abnormal; Notable for the following:    WBC 14.9 (*)    RBC 3.51 (*)    Hemoglobin 9.6 (*)    HCT 29.1 (*)    RDW 15.9 (*)    Platelets 132 (*)    All other components within normal limits  BASIC METABOLIC PANEL - Abnormal; Notable for the following:    Sodium 134 (*)    Glucose, Bld 118 (*)    BUN 43 (*)    Creatinine, Ser 1.82 (*)    GFR calc non Af Amer 32 (*)    GFR calc Af Amer 37 (*)    All other components within normal limits  URINALYSIS, ROUTINE W REFLEX MICROSCOPIC - Abnormal; Notable for the following:    APPearance CLOUDY (*)    Hgb urine dipstick SMALL (*)    Nitrite POSITIVE (*)    Leukocytes, UA LARGE (*)    All other components within normal limits  URINE MICROSCOPIC-ADD ON - Abnormal; Notable for the following:    Bacteria, UA MANY (*)    All other components within normal limits  URINE CULTURE  CULTURE, BLOOD (ROUTINE X 2)  CULTURE, BLOOD (ROUTINE X 2)  VALPROIC ACID LEVEL  I-STAT TROPOININ, ED    Imaging Review No results found.   EKG Interpretation   Date/Time:  Monday October 20 2014 15:39:08 EST Ventricular Rate:  68 PR Interval:  163 QRS Duration: 78 QT Interval:  449 QTC Calculation: 477 R Axis:   41 Text Interpretation:  Sinus  rhythm Abnrm T, consider ischemia,  anterolateral lds Nonspecific T wave abnormality Confirmed by Donnald GarrePfeiffer,  MD, Lebron ConnersMarcy [phone]([id number]) on 10/20/2014 4:12:46 PM      MDM   Final diagnoses:  UTI (lower urinary tract infection)  Generalized weakness  Ambulatory dysfunction   The patient's symptoms of generalized weakness are consistent with UTI. At this point time there are no focal neurologic deficits to suggest CVA. The patient is alert and in no respiratory distress. He does suffer from dementia and has some baseline confusion. As a baseline he uses a walker independently but has become too weak  to do so since development of this illness.    Arby BarretteMarcy Nikyla Navedo, MD 10/20/14 [phone]

## 2014-10-20 NOTE — ED Notes (Signed)
Attempted to start IV, unable to get access, 2nd RN at bedside with ultrasound machine.

## 2014-10-20 NOTE — H&P (Signed)
[facility] History and Physical  Ronnie SaundersHarold H Hood [id number] DOB: 21-Aug-1926 DOA: 10/20/2014  Referring physician: ER physician PCP: No PCP Per Patient   Chief Complaint: weakness  HPI:  80 year old male with past medical history of hypertension, BPH, CKD stage 3 who [facility] with sudden onset weakness noticed 2 days prior to this admission. No reports of falls, loss of consciousness. No chest pain, shortness of breath or palpitations. No abdominal pain, nausea or vomiting. No fevers or chills. No diarrhea or constipation. No GU complains.   In ED, BP was 113/50, HR 16, T mac 98.6 F and oxygen saturation 99% on room air. Blood work showed WBC count 14.9, hemoglobin 9.6, sodium 134, platelet count 132, creatinine 1.82. UA showed large leukocytes and many bacteria. He was started on Levaquin for treatment for UTI.    Assessment & Plan    Principal Problem:   UTI (lower urinary tract infection) / Leukocytosis - UA with large leukocytes, many bacteria - started on Levaquin - follow up urine culture results  Active Problems:   Generalized weakness - perhaps due to UTI - PT evaluation once pt able to participate    Hypertension - resume Norvasc and lisinopril - would hold maxzide since BP on admission 113/50.   Allergic rhinitis - continue Claritin    BPH (benign prostatic hyperplasia) / overactive bladder - resume terazosin and toviaz   CKD (chronic kidney disease) stage 3, GFR 30-59 ml/min - creatinine about 6 years ago in 1.9 range; on this admission 1.82 - ok to resume home meds but will hold Maxzide since BP 113/50 and Norvasc and lisinopril should be sufficient    Thrombocytopenia - mild - will use SCD's for DVT prophylaxis   Anemia of chronic disease - secondary to history of CKD - hemoglobin is 9.6 - no current indications for transfusion   DVT prophylaxis:  - SCD's bilaterally due to mild thrombocytopenia   Radiological Exams on  Admission: No results found.   Code Status: Full Family Communication: Family not at the bedside this am  Disposition Plan: Admit for further evaluation  Manson PasseyEVINE, Yandriel Boening, MD  [facility] Pager [phone]  Review of Systems:  Constitutional: Negative for fever, chills and malaise/fatigue. Negative for diaphoresis.  HENT: Negative for hearing loss, ear pain, nosebleeds, congestion, sore throat, neck pain, tinnitus and ear discharge.   Eyes: Negative for blurred vision, double vision, photophobia, pain, discharge and redness.  Respiratory: Negative for cough, hemoptysis, sputum production, shortness of breath, wheezing and stridor.   Cardiovascular: Negative for chest pain, palpitations, orthopnea, claudication and leg swelling.  Gastrointestinal: Negative for nausea, vomiting and abdominal pain. Negative for heartburn, constipation, blood in stool and melena.  Genitourinary: Negative for dysuria, urgency, frequency, hematuria and flank pain.  Musculoskeletal: Negative for myalgias, back pain, joint pain and falls.  Skin: Negative for itching and rash.  Neurological: per HPI Endo/Heme/Allergies: Negative for environmental allergies and polydipsia. Does not bruise/bleed easily.  Psychiatric/Behavioral: Negative for suicidal ideas. The patient is not nervous/anxious.      Past Medical History  Diagnosis Date  . Hypertension   . Stroke    Past Surgical History  Procedure Laterality Date  . No past surgeries     Social History:  reports that he has never smoked. He has never used smokeless tobacco. He reports that he does not drink alcohol or use illicit drugs.  Allergies  Allergen Reactions  . Penicillins Rash  . Sulfa Antibiotics Rash  .  Codeine Nausea Only and Other (See Comments)    Headache    Family History: htn in family    Prior to Admission medications   Medication Sig Start Date End Date Taking? Authorizing Provider  amLODipine (NORVASC) 10 MG tablet Take 10 mg  by mouth daily.    Historical Provider, MD  aspirin 325 MG EC tablet Take 325 mg by mouth daily.    Historical Provider, MD  divalproex (DEPAKOTE ER) 500 MG 24 hr tablet Take 500 mg by mouth daily.    Historical Provider, MD  ergocalciferol (VITAMIN D2) 50000 UNITS capsule Take 50,000 Units by mouth once a week.    Historical Provider, MD  ferrous sulfate 325 (65 FE) MG tablet Take 325 mg by mouth 2 (two) times daily with a meal.    Historical Provider, MD  Fesoterodine Fumarate (TOVIAZ) 8 MG TB24 Take 8 mg by mouth daily.    Historical Provider, MD  galantamine (RAZADYNE ER) 16 MG 24 hr capsule Take 16 mg by mouth daily with breakfast.    Historical Provider, MD  lisinopril (PRINIVIL,ZESTRIL) 40 MG tablet Take 40 mg by mouth daily.    Historical Provider, MD  loratadine (ALLERGY RELIEF) 10 MG tablet Take 10 mg by mouth daily.    Historical Provider, MD  mometasone (NASONEX) 50 MCG/ACT nasal spray Place 2 sprays into the nose daily. 10/10/12   Elvina Sidle, MD  Multiple Vitamin (MULTIVITAMIN) tablet Take 1 tablet by mouth daily.    Historical Provider, MD  omeprazole (PRILOSEC) 20 MG capsule Take 20 mg by mouth daily.    Historical Provider, MD  terazosin (HYTRIN) 2 MG capsule Take 2 mg by mouth at bedtime.    Historical Provider, MD  triamterene-hydrochlorothiazide [facility]) 37.5-25 MG per tablet TAKE 1 TABLET EVERY DAY 04/07/13   Nelva Nay, PA-C   Physical Exam: Filed Vitals:   10/20/14 1325 10/20/14 1540 10/20/14 1716  BP: 113/50 149/66 132/66  Pulse: 67 68 71  Temp: 98.2 F (36.8 C)  98.6 F (37 C)  TempSrc: Oral  Oral  Resp: 20 19 16   SpO2: 99% 99% 98%    Physical Exam  Constitutional: Appears well-developed and well-nourished. No distress.  HENT: Normocephalic. No tonsillar erythema or exudates Eyes: Conjunctivae are normal. PERRLA, no scleral icterus.  Neck: Normal ROM. Neck supple. No JVD. No tracheal deviation. No thyromegaly.  CVS: RRR, S1/S2 appreciated, SEM  appreciated  Pulmonary: Effort and breath sounds normal, no stridor, rhonchi, wheezes, rales.  Abdominal: Soft. BS +,  no distension, tenderness, rebound or guarding.  Musculoskeletal: Normal range of motion. No edema and no tenderness.  Lymphadenopathy: No lymphadenopathy noted, cervical, inguinal. Neuro: Alert. Normal reflexes, muscle tone coordination. No focal neurologic deficits. Skin: Skin is warm and dry. Psychiatric: Normal mood and affect.   Labs on Admission:  Basic Metabolic Panel:  Recent Labs Lab 10/20/14 1412  NA 134*  K 4.3  CL 100  CO2 20  GLUCOSE 118*  BUN 43*  CREATININE 1.82*  CALCIUM 9.3   Liver Function Tests: No results for input(s): AST, ALT, ALKPHOS, BILITOT, PROT, ALBUMIN in the last 168 hours. No results for input(s): LIPASE, AMYLASE in the last 168 hours. No results for input(s): AMMONIA in the last 168 hours. CBC:  Recent Labs Lab 10/20/14 1412  WBC 14.9*  HGB 9.6*  HCT 29.1*  MCV 82.9  PLT 132*   Cardiac Enzymes: No results for input(s): CKTOTAL, CKMB, CKMBINDEX, TROPONINI in the last 168 hours. BNP: Invalid input(s):  POCBNP CBG: No results for input(s): GLUCAP in the last 168 hours.  If 7PM-7AM, please contact night-coverage [email or web address] Password [email or web address] 10/20/2014, 5:16 PM

## 2014-10-20 NOTE — Progress Notes (Signed)
Called for admission at 4:26pm. Adm orders placed. Adm for UTI, med-surg floor. Full note to follow.  Ronnie Hood

## 2014-10-21 LAB — CBC
HEMATOCRIT: 26.5 % — AB (ref 39.0–52.0)
HEMOGLOBIN: 8.8 g/dL — AB (ref 13.0–17.0)
MCH: 27.5 pg (ref 26.0–34.0)
MCHC: 33.2 g/dL (ref 30.0–36.0)
MCV: 82.8 fL (ref 78.0–100.0)
Platelets: 118 10*3/uL — ABNORMAL LOW (ref 150–400)
RBC: 3.2 MIL/uL — ABNORMAL LOW (ref 4.22–5.81)
RDW: 15.7 % — ABNORMAL HIGH (ref 11.5–15.5)
WBC: 12.1 10*3/uL — AB (ref 4.0–10.5)

## 2014-10-21 LAB — COMPREHENSIVE METABOLIC PANEL
ALBUMIN: 2.2 g/dL — AB (ref 3.5–5.2)
ALT: <5 U/L (ref 0–53)
AST: 13 U/L (ref 0–37)
Alkaline Phosphatase: 48 U/L (ref 39–117)
Anion gap: 13 (ref 5–15)
BUN: 45 mg/dL — ABNORMAL HIGH (ref 6–23)
CALCIUM: 8.8 mg/dL (ref 8.4–10.5)
CO2: 21 mEq/L (ref 19–32)
Chloride: 105 mEq/L (ref 96–112)
Creatinine, Ser: 1.82 mg/dL — ABNORMAL HIGH (ref 0.50–1.35)
GFR calc non Af Amer: 32 mL/min — ABNORMAL LOW (ref >90)
GFR, EST AFRICAN AMERICAN: 37 mL/min — AB (ref >90)
Glucose, Bld: 98 mg/dL (ref 70–99)
Potassium: 4 mEq/L (ref 3.7–5.3)
Sodium: 139 mEq/L (ref 137–147)
TOTAL PROTEIN: 6 g/dL (ref 6.0–8.3)
Total Bilirubin: 0.4 mg/dL (ref 0.3–1.2)

## 2014-10-21 LAB — GLUCOSE, CAPILLARY: Glucose-Capillary: 84 mg/dL (ref 70–99)

## 2014-10-21 MED ORDER — SODIUM CHLORIDE 0.9 % IV BOLUS (SEPSIS)
250.0000 mL | Freq: Once | INTRAVENOUS | Status: AC
  Administered 2014-10-21: 250 mL via INTRAVENOUS

## 2014-10-21 MED ORDER — GALANTAMINE HYDROBROMIDE 4 MG PO TABS
8.0000 mg | ORAL_TABLET | Freq: Two times a day (BID) | ORAL | Status: DC
  Administered 2014-10-21 – 2014-10-22 (×2): 8 mg via ORAL
  Filled 2014-10-21 (×4): qty 2

## 2014-10-21 NOTE — Progress Notes (Signed)
CARE MANAGEMENT NOTE 10/21/2014  Patient:  Josie SaundersCHAVIS,Zavien H   Account Number:  [id number]  Date Initiated:  10/21/2014  Documentation initiated by:  Ferdinand CavaSCHETTINO,Clover Feehan  Subjective/Objective Assessment:   80 yo male admitted from home with UTI     Action/Plan:   discharge planning   Anticipated DC Date:  10/22/2014   Anticipated DC Plan:  HOME W HOME HEALTH SERVICES      DC Planning Services  CM consult      Choice offered to / List presented to:  C-5 Sibling           HH agency  [facility]   Status of service:  In process, will continue to follow Medicare Important Message given?   (If response is "NO", the following Medicare IM given date fields will be blank) Date Medicare IM given:   Medicare IM given by:   Date Additional Medicare IM given:   Additional Medicare IM given by:    Discharge Disposition:  HOME/SELF CARE  Per UR Regulation:    If discussed at Long Length of Stay Meetings, dates discussed:    Comments:  10/21/14 Ferdinand CavaAndrea Schettino RN, BSN, CM [phone] Spoke with the patient and his sister, [name], and also Thurston Holenne. The patient asked this CM to communicate with his siblings, esp Thurston Holenne, since he lives with her. Called Thurston Holenne [phone] and she stated that the patient has lived with her for the past 2 years. His local PCP is [facility] Clear[facility] and [facility] Kandra NicolasVadayo. She stated that the patient has in the home a walker, shower chair, and a wheelchair. The [place] also [place] [facility] [facility] [place] 4 days a week for 2 hours each day. [facility] and [facility] stated [facility], choice was offered and she wants [facility]. Will continue to follow, awaiting MD orders.

## 2014-10-21 NOTE — Progress Notes (Signed)
Patient ID: Ronnie Hood, male   DOB: 1926/08/06, 80 y.o.   MRN: [id number]  [facility] PROGRESS NOTE  Ronnie Hood [id number] DOB: 1926/08/06 DOA: 10/20/2014 PCP: No PCP Per Patient  Brief narrative: 80 year old male with past medical history of hypertension, BPH, CKD stage 3 who [facility] with sudden onset weakness noticed 2 days prior to this admission. No reports of falls, loss of consciousness. No chest pain, shortness of breath or palpitations. No abdominal pain, nausea or vomiting. No fevers or chills. No diarrhea or constipation. No GU complains.   In ED, BP was 113/50, HR 16, T mac 98.6 F and oxygen saturation 99% on room air. Blood work showed WBC count 14.9, hemoglobin 9.6, sodium 134, platelet count 132, creatinine 1.82. UA showed large leukocytes and many bacteria. He was started on Levaquin for treatment for UTI.  Assessment and Plan:   Principal Problem:  UTI (lower urinary tract infection) / Leukocytosis - UA with large leukocytes, many bacteria - started on Levaquin and responding well, remains afebrile and WBC trending down  - follow up urine culture results  Active Problems:  Generalized weakness - perhaps due to UTI - PT evaluation requested   Hypertension - continue Norvasc and lisinopril - hold Maxzide for now - reasonable inpatient control   Allergic rhinitis - continue Claritin   BPH (benign prostatic hyperplasia) / overactive bladder - resume terazosin and toviaz  CKD (chronic kidney disease) stage 3, GFR 30-59 ml/min - creatinine about 6 years ago in 1.9 range; on this admission 1.82 - continue to hold Maxzide   Thrombocytopenia - mild - will use SCD's for DVT prophylaxis  Anemia of chronic disease - secondary to history of CKD - hemoglobin is 9.6 - no current indications for transfusion   DVT prophylaxis  SCD  Code Status: Full Family Communication: Pt at bedside Disposition Plan: Home when  medically stable  IV Access:   Peripheral IV Procedures and diagnostic studies:    No results found.  Medical Consultants:   None  Other Consultants:   Physical therapy  Anti-Infectives:   Levaquin 11/16 -->  Debbora PrestoMAGICK-Teasia Zapf, MD  [facility] Pager [phone]  If 7PM-7AM, please contact night-coverage [email or web address] Password [email or web address] 10/21/2014, 6:02 PM   LOS: 1 day   HPI/Subjective: No events overnight.   Objective: Filed Vitals:   10/20/14 2300 10/21/14 0556 10/21/14 0704 10/21/14 1349  BP:  113/54  124/57  Pulse:  56  63  Temp: 98.8 F (37.1 C) 99.6 F (37.6 C)  98.1 F (36.7 C)  TempSrc:  Oral  Oral  Resp:  18  19  Weight:   73.1 kg (161 lb 2.5 oz)   SpO2:  99%  100%    Intake/Output Summary (Last 24 hours) at 10/21/14 1802 Last data filed at 10/21/14 1500  Gross per 24 hour  Intake 1248.75 ml  Output      0 ml  Net 1248.75 ml    Exam:   General:  Pt is alert, follows commands appropriately, not in acute distress  Cardiovascular: Regular rate and rhythm, no rubs, no gallops  Respiratory: Clear to auscultation bilaterally, no wheezing, no crackles, no rhonchi  Abdomen: Soft, non tender, non distended, bowel sounds present, no guarding   Data Reviewed: Basic Metabolic Panel:  Recent Labs Lab 10/20/14 1412 10/21/14 0550  NA 134* 139  K 4.3 4.0  CL 100 105  CO2 20 21  GLUCOSE 118* 98  BUN 43* 45*  CREATININE 1.82* 1.82*  CALCIUM 9.3 8.8   Liver Function Tests:  Recent Labs Lab 10/21/14 0550  AST 13  ALT <5  ALKPHOS 48  BILITOT 0.4  PROT 6.0  ALBUMIN 2.2*   CBC:  Recent Labs Lab 10/20/14 1412 10/21/14 0550  WBC 14.9* 12.1*  HGB 9.6* 8.8*  HCT 29.1* 26.5*  MCV 82.9 82.8  PLT 132* 118*   Cardiac Enzymes: No results for input(s): CKTOTAL, CKMB, CKMBINDEX, TROPONINI in the last 168 hours. BNP: Invalid input(s): POCBNP CBG:  Recent Labs Lab 10/21/14 0733  GLUCAP 84    Recent Results (from the past 240 hour(s))  Culture,  blood (routine x 2)     Status: None (Preliminary result)   Collection Time: 10/20/14  3:22 PM  Result Value Ref Range Status   Specimen Description BLOOD RIGHT ANTECUBITAL  Final   Special Requests BOTTLES DRAWN AEROBIC AND ANAEROBIC 5 ML  Final   Culture  Setup Time   Final    10/20/2014 20:56 Performed at [facility]    Culture   Final           BLOOD CULTURE RECEIVED NO GROWTH TO DATE CULTURE WILL BE HELD FOR 5 DAYS BEFORE ISSUING A FINAL NEGATIVE REPORT Performed at [facility]    Report Status PENDING  Incomplete  Culture, blood (routine x 2)     Status: None (Preliminary result)   Collection Time: 10/20/14  3:22 PM  Result Value Ref Range Status   Specimen Description BLOOD RIGHT HAND  Final   Special Requests BOTTLES DRAWN AEROBIC AND ANAEROBIC 3 ML  Final   Culture  Setup Time   Final    10/20/2014 20:56 Performed at [facility]    Culture   Final           BLOOD CULTURE RECEIVED NO GROWTH TO DATE CULTURE WILL BE HELD FOR 5 DAYS BEFORE ISSUING A FINAL NEGATIVE REPORT Performed at [facility]    Report Status PENDING  Incomplete     Scheduled Meds: . amLODipine  10 mg Oral Daily  . aspirin  325 mg Oral Daily  . divalproex  500 mg Oral Daily  . ferrous sulfate  325 mg Oral BID WC  . fluticasone  1 spray Each Nare Daily  . galantamine  16 mg Oral Q breakfast  . [START ON 10/22/2014] levofloxacin (LEVAQUIN) IV  750 mg Intravenous Q48H  . lisinopril  40 mg Oral Daily  . loratadine  10 mg Oral Daily  . pantoprazole  40 mg Oral Daily  . terazosin  2 mg Oral QHS   Continuous Infusions:

## 2014-10-21 NOTE — Evaluation (Signed)
Physical Therapy Evaluation Patient Details Name: Ronnie Hood MRN: [id number] DOB: September 03, 1926 Today's Date: 10/21/2014   History of Present Illness  80 year old male with past medical history of hypertension, BPH, CKD stage 3, CVA who [facility] with sudden onset weakness noticed 2 days prior to this admission.  Dx of UTI.   Clinical Impression  *Pt admitted with weakness, UTI**. Pt currently with functional limitations due to the deficits listed below (see PT Problem List).  Pt will benefit from skilled PT to increase their independence and safety with mobility to allow discharge to the venue listed below.   MOd assist for supine to sit and to take several pivotal steps to recliner with RW.   **    Follow Up Recommendations Home health PT    Equipment Recommendations  None recommended by PT    Recommendations for Other Services       Precautions / Restrictions Precautions Precautions: Fall Restrictions Weight Bearing Restrictions: No      Mobility  Bed Mobility Overal bed mobility: Needs Assistance Bed Mobility: Supine to Sit;Rolling Rolling: Min assist   Supine to sit: Mod assist     General bed mobility comments: cues for technique, mod A to raise trunk  Transfers Overall transfer level: Needs assistance Equipment used: Rolling walker (2 wheeled) Transfers: Sit to/from Stand Sit to Stand: Mod assist;From elevated surface         General transfer comment: cues for hand placement  Ambulation/Gait Ambulation/Gait assistance: Min assist Ambulation Distance (Feet): 4 Feet Assistive device: Rolling walker (2 wheeled) Gait Pattern/deviations: Step-to pattern     General Gait Details: cues for positioning in RW and for safety  Stairs            Wheelchair Mobility    Modified Rankin (Stroke Patients Only)       Balance Overall balance assessment: Needs assistance Sitting-balance support: Feet supported;No upper extremity  supported Sitting balance-Leahy Scale: Good     Standing balance support: Bilateral upper extremity supported Standing balance-Leahy Scale: Fair                               Pertinent Vitals/Pain Pain Assessment: No/denies pain    Home Living Family/patient expects to be discharged to:: Private residence Living Arrangements: Other relatives (sister) Available Help at Discharge: Family;Available PRN/intermittently   Home Access: Level entry     Home Layout: One level Home Equipment: Walker - 2 wheels;Wheelchair - manual;Shower seat      Prior Function Level of Independence: Independent with assistive device(s)               Hand Dominance   Dominant Hand: Right    Extremity/Trunk Assessment   Upper Extremity Assessment: Overall WFL for tasks assessed           Lower Extremity Assessment:  (chronic burning sensation in  B feet 2* frost[place]      Cervical / Trunk Assessment: Normal  Communication   Communication: HOH  Cognition Arousal/Alertness: Awake/alert Behavior During Therapy: WFL for tasks assessed/performed Overall Cognitive Status: Within Functional Limits for tasks assessed                      General Comments      Exercises        Assessment/Plan    PT Assessment Patient needs continued PT services  PT Diagnosis Generalized  weakness   PT Problem List Decreased activity tolerance;Decreased balance;Decreased knowledge of use of DME;Decreased mobility  PT Treatment Interventions DME instruction;Gait training;Functional mobility training;Therapeutic activities;Patient/family education;Therapeutic exercise;Balance training   PT Goals (Current goals can be found in the Care Plan section) Acute Rehab PT Goals Patient Stated Goal: to get strength back PT Goal Formulation: With patient Time For Goal Achievement: 11/04/14 Potential to Achieve Goals: Good    Frequency Min 3X/week   Barriers to  discharge        Co-evaluation               End of Session Equipment Utilized During Treatment: Gait belt Activity Tolerance: Patient limited by fatigue Patient left: in chair;with call bell/phone within reach Nurse Communication: Mobility status         Time: 1100-1125 PT Time Calculation (min) (ACUTE ONLY): 25 min   Charges:   PT Evaluation $Initial PT Evaluation Tier I: 1 Procedure PT Treatments $Therapeutic Activity: 8-22 mins   PT G Codes:          Tamala SerUhlenberg, Ananth Fiallos Kistler 10/21/2014, 12:03 PM [phone]

## 2014-10-22 LAB — BASIC METABOLIC PANEL
ANION GAP: 13 (ref 5–15)
BUN: 48 mg/dL — ABNORMAL HIGH (ref 6–23)
CALCIUM: 8.7 mg/dL (ref 8.4–10.5)
CO2: 20 meq/L (ref 19–32)
Chloride: 103 mEq/L (ref 96–112)
Creatinine, Ser: 2.04 mg/dL — ABNORMAL HIGH (ref 0.50–1.35)
GFR calc Af Amer: 32 mL/min — ABNORMAL LOW (ref >90)
GFR, EST NON AFRICAN AMERICAN: 28 mL/min — AB (ref >90)
GLUCOSE: 111 mg/dL — AB (ref 70–99)
POTASSIUM: 3.5 meq/L — AB (ref 3.7–5.3)
SODIUM: 136 meq/L — AB (ref 137–147)

## 2014-10-22 LAB — CBC
HCT: 25.9 % — ABNORMAL LOW (ref 39.0–52.0)
HEMOGLOBIN: 8.5 g/dL — AB (ref 13.0–17.0)
MCH: 27.3 pg (ref 26.0–34.0)
MCHC: 32.8 g/dL (ref 30.0–36.0)
MCV: 83.3 fL (ref 78.0–100.0)
Platelets: 117 10*3/uL — ABNORMAL LOW (ref 150–400)
RBC: 3.11 MIL/uL — AB (ref 4.22–5.81)
RDW: 16 % — ABNORMAL HIGH (ref 11.5–15.5)
WBC: 8.3 10*3/uL (ref 4.0–10.5)

## 2014-10-22 LAB — GLUCOSE, CAPILLARY: Glucose-Capillary: 117 mg/dL — ABNORMAL HIGH (ref 70–99)

## 2014-10-22 MED ORDER — LEVOFLOXACIN 500 MG PO TABS: 500.0000 mg | ORAL_TABLET | ORAL | Status: DC

## 2014-10-22 NOTE — Progress Notes (Signed)
Discharge instructions and medications reviewed with brother who has no questions at this time and verbalizes understanding. Brother confirms patient has all personal belongings in possession. Patient d/c home.

## 2014-10-22 NOTE — Discharge Instructions (Signed)
PLEASE STOP TAKING LOSARTAN AND MAXZIDE UNTIL SEEN BY PCP  Urinary Tract Infection Urinary tract infections (UTIs) can develop anywhere along your urinary tract. Your urinary tract is your body's drainage system for removing wastes and extra water. Your urinary tract includes two kidneys, two ureters, a bladder, and a urethra. Your kidneys are a pair of bean-shaped organs. Each kidney is about the size of your fist. They are located below your ribs, one on each side of your spine. CAUSES Infections are caused by microbes, which are microscopic organisms, including fungi, viruses, and bacteria. These organisms are so small that they can only be seen through a microscope. Bacteria are the microbes that most commonly cause UTIs. SYMPTOMS  Symptoms of UTIs may vary by age and gender of the patient and by the location of the infection. Symptoms in young women typically include a frequent and intense urge to urinate and a painful, burning feeling in the bladder or urethra during urination. Older women and men are more likely to be tired, shaky, and weak and have muscle aches and abdominal pain. A fever may mean the infection is in your kidneys. Other symptoms of a kidney infection include pain in your back or sides below the ribs, nausea, and vomiting. DIAGNOSIS To diagnose a UTI, your caregiver will ask you about your symptoms. Your caregiver also will ask to provide a urine sample. The urine sample will be tested for bacteria and white blood cells. White blood cells are made by your body to help fight infection. TREATMENT  Typically, UTIs can be treated with medication. Because most UTIs are caused by a bacterial infection, they usually can be treated with the use of antibiotics. The choice of antibiotic and length of treatment depend on your symptoms and the type of bacteria causing your infection. HOME CARE INSTRUCTIONS  If you were prescribed antibiotics, take them exactly as your caregiver instructs  you. Finish the medication even if you feel better after you have only taken some of the medication.  Drink enough water and fluids to keep your urine clear or pale yellow.  Avoid caffeine, tea, and carbonated beverages. They tend to irritate your bladder.  Empty your bladder often. Avoid holding urine for long periods of time.  Empty your bladder before and after sexual intercourse.  After a bowel movement, women should cleanse from front to back. Use each tissue only once. SEEK MEDICAL CARE IF:   You have back pain.  You develop a fever.  Your symptoms do not begin to resolve within 3 days. SEEK IMMEDIATE MEDICAL CARE IF:   You have severe back pain or lower abdominal pain.  You develop chills.  You have nausea or vomiting.  You have continued burning or discomfort with urination. MAKE SURE YOU:   Understand these instructions.  Will watch your condition.  Will get help right away if you are not doing well or get worse. Document Released: 08/31/2005 Document Revised: 05/22/2012 Document Reviewed: 12/30/2011 [facility] Patient Information 2015 [employer] [place]. This information is not intended to replace advice given to you by your health care provider. Make sure you discuss any questions you have with your health care provider.

## 2014-10-22 NOTE — Discharge Summary (Signed)
Physician Discharge Summary  Ronnie Hood ZOX:[id number] DOB: 19-Apr-1926 DOA: 10/20/2014  PCP: No PCP Per Patient  Admit date: 10/20/2014 Discharge date: 10/22/2014  Recommendations for Outpatient Follow-up:  1. Pt will need to follow up with PCP in 2-3 weeks post discharge 2. Please obtain BMP to evaluate electrolytes and kidney function 3. Please also check CBC to evaluate Hg and Hct levels 4. Stop taking losartan, lisinopril, and Maxzide until renal function stabilizes  5. Levaquin QOD for 5 doses   Discharge Diagnoses:  Principal Problem:   UTI (lower urinary tract infection) Active Problems:   Hypertension   Allergic rhinitis   BPH (benign prostatic hyperplasia)   Leukocytosis   CKD (chronic kidney disease) stage 3, GFR 30-59 ml/min   Thrombocytopenia   Anemia of chronic disease   Generalized weakness    Discharge Condition: Stable  Diet recommendation: Heart healthy diet discussed in details    Brief narrative: 80 year old male with past medical history of hypertension, BPH, CKD stage 3 who presented to [facility] ED 10/20/2014 with sudden onset weakness noticed 2 days prior to this admission. No reports of falls, loss of consciousness. No chest pain, shortness of breath or palpitations. No abdominal pain, nausea or vomiting. No fevers or chills. No diarrhea or constipation. No GU complains.   In ED, BP was 113/50, HR 16, T mac 98.6 F and oxygen saturation 99% on room air. Blood work showed WBC count 14.9, hemoglobin 9.6, sodium 134, platelet count 132, creatinine 1.82. UA showed large leukocytes and many bacteria. He was started on Levaquin for treatment for UTI.  Assessment and Plan:   Principal Problem:  UTI (lower urinary tract infection) / Leukocytosis - UA with large leukocytes, many bacteria - started on Levaquin and responding well, remains afebrile and WBC trending down  - continue Levaquin QOD for 5 more doses  Active Problems:  Generalized weakness -  perhaps due to UTI - ambulating with assistance in hallway  Hypertension - continue Norvasc - hold Maxzide, Lisinopril, Losartan for now due to soft BP and until renal function stabilizes   Allergic rhinitis - continue Claritin   BPH (benign prostatic hyperplasia) / overactive bladder - resume terazosin and toviaz  CKD (chronic kidney disease) stage 3, GFR 30-59 ml/min - creatinine about 6 years ago in 1.9 range; on this admission 1.82 - holding ACEI/ARBs for now   Thrombocytopenia - mild with no signs of active bleeding   Anemia of chronic disease - secondary to history of CKD - no current indications for transfusion   DVT prophylaxis  SCD inpatient   Code Status: Full Family Communication: Pt and caregivers at bedside Disposition Plan: Home   IV Access:    Peripheral IV Procedures and diagnostic studies:      Imaging Results (Last 48 hours)    No results found.   Medical Consultants:    None  Other Consultants:    Physical therapy  Anti-Infectives:    Levaquin 11/16 --> 5 more doses   Discharge Exam: Filed Vitals:   10/22/14 1001  BP: 102/47  Pulse: 54  Temp: 98.6 F (37 C)  Resp: 18   Filed Vitals:   10/21/14 1900 10/21/14 2136 10/22/14 0623 10/22/14 1001  BP: 117/61 99/43 116/48 102/47  Pulse: 59 54 54 54  Temp: 98.4 F (36.9 C) 98.8 F (37.1 C) 99.2 F (37.3 C) 98.6 F (37 C)  TempSrc: Oral Oral Oral Oral  Resp: 18 16 18 18   Weight:  SpO2: 97% 96% 93% 97%    General: Pt is alert, follows commands appropriately, not in acute distress Cardiovascular: Regular rate and rhythm, no rubs, no gallops Respiratory: Clear to auscultation bilaterally, no wheezing, no crackles, no rhonchi Abdominal: Soft, non tender, non distended, bowel sounds +, no guarding Extremities: no cyanosis, pulses palpable bilaterally DP and PT Neuro: Grossly nonfocal  Discharge Instructions  Discharge Instructions    Diet - low sodium heart  healthy    Complete by:  As directed      Increase activity slowly    Complete by:  As directed             Medication List    STOP taking these medications        lisinopril 40 MG tablet  Commonly known as:  PRINIVIL,ZESTRIL     losartan 100 MG tablet  Commonly known as:  COZAAR     triamterene-hydrochlorothiazide 37.5-25 MG per tablet  Commonly known as:  MAXZIDE-25      TAKE these medications        ALLERGY RELIEF 10 MG tablet  Generic drug:  loratadine  Take 10 mg by mouth daily as needed for allergies.     amLODipine 10 MG tablet  Commonly known as:  NORVASC  Take 5 mg by mouth daily.     aspirin 325 MG EC tablet  Take 325 mg by mouth daily.     divalproex 500 MG 24 hr tablet  Commonly known as:  DEPAKOTE ER  Take 500 mg by mouth daily.     ferrous sulfate 325 (65 FE) MG tablet  Take 325 mg by mouth 2 (two) times daily with a meal.     galantamine 16 MG 24 hr capsule  Commonly known as:  RAZADYNE ER  Take 16 mg by mouth daily with breakfast.     levofloxacin 500 MG tablet  Commonly known as:  LEVAQUIN  Take 1 tablet (500 mg total) by mouth every other day.     mometasone 50 MCG/ACT nasal spray  Commonly known as:  NASONEX  Place 2 sprays into the nose daily.     omeprazole 20 MG capsule  Commonly known as:  PRILOSEC  Take 20 mg by mouth daily.     terazosin 2 MG capsule  Commonly known as:  HYTRIN  Take 2 mg by mouth at bedtime.     VITAMIN D-3 PO  Take 1 tablet by mouth daily.           Follow-up Information    Follow up with Debbora PrestoMAGICK-Gabreille Dardis, MD.   Specialty:  Internal Medicine   Why:  As needed, If symptoms worsen   Contact information:   [address] [place] [address] [phone]        The results of significant diagnostics from this hospitalization (including imaging, microbiology, ancillary and laboratory) are listed below for reference.     Microbiology: Recent Results (from the past 240  hour(s))  Urine culture     Status: None (Preliminary result)   Collection Time: 10/20/14  3:18 PM  Result Value Ref Range Status   Specimen Description URINE, CATHETERIZED  Final   Special Requests NONE  Final   Culture  Setup Time   Final    10/20/2014 23:20 Performed at [facility]    Colony Count PENDING  Incomplete   Culture   Final    Culture reincubated for better growth Performed at [facility]  Report Status PENDING  Incomplete  Culture, blood (routine x 2)     Status: None (Preliminary result)   Collection Time: 10/20/14  3:22 PM  Result Value Ref Range Status   Specimen Description BLOOD RIGHT ANTECUBITAL  Final   Special Requests BOTTLES DRAWN AEROBIC AND ANAEROBIC 5 ML  Final   Culture  Setup Time   Final    10/20/2014 20:56 Performed at [facility]    Culture   Final           BLOOD CULTURE RECEIVED NO GROWTH TO DATE CULTURE WILL BE HELD FOR 5 DAYS BEFORE ISSUING A FINAL NEGATIVE REPORT Performed at [facility]    Report Status PENDING  Incomplete  Culture, blood (routine x 2)     Status: None (Preliminary result)   Collection Time: 10/20/14  3:22 PM  Result Value Ref Range Status   Specimen Description BLOOD RIGHT HAND  Final   Special Requests BOTTLES DRAWN AEROBIC AND ANAEROBIC 3 ML  Final   Culture  Setup Time   Final    10/20/2014 20:56 Performed at [facility]    Culture   Final           BLOOD CULTURE RECEIVED NO GROWTH TO DATE CULTURE WILL BE HELD FOR 5 DAYS BEFORE ISSUING A FINAL NEGATIVE REPORT Performed at [facility]    Report Status PENDING  Incomplete     Labs: Basic Metabolic Panel:  Recent Labs Lab 10/20/14 1412 10/21/14 0550 10/22/14 0442  NA 134* 139 136*  K 4.3 4.0 3.5*  CL 100 105 103  CO2 20 21 20   GLUCOSE 118* 98 111*  BUN 43* 45* 48*  CREATININE 1.82* 1.82* 2.04*  CALCIUM 9.3 8.8 8.7   Liver Function Tests:  Recent Labs Lab 10/21/14 0550  AST 13  ALT <5   ALKPHOS 48  BILITOT 0.4  PROT 6.0  ALBUMIN 2.2*   No results for input(s): LIPASE, AMYLASE in the last 168 hours. No results for input(s): AMMONIA in the last 168 hours. CBC:  Recent Labs Lab 10/20/14 1412 10/21/14 0550 10/22/14 0442  WBC 14.9* 12.1* 8.3  HGB 9.6* 8.8* 8.5*  HCT 29.1* 26.5* 25.9*  MCV 82.9 82.8 83.3  PLT 132* 118* 117*   Cardiac Enzymes: CBG:  Recent Labs Lab 10/21/14 0733 10/22/14 0738  GLUCAP 84 117*     SIGNED: Time coordinating discharge: Over 30 minutes  Debbora PrestoMAGICK-Day Greb, MD  [facility] 10/22/2014, 11:09 AM Pager [phone]  If 7PM-7AM, please contact night-coverage [email or web address] Password [email or web address]

## 2014-10-22 NOTE — Progress Notes (Signed)
CARE MANAGEMENT NOTE 10/22/2014  Patient:  Ronnie Hood,Ronnie Hood   Account Number:  [id number]  Date Initiated:  10/21/2014  Documentation initiated by:  Ferdinand CavaSCHETTINO,Edna Grover  Subjective/Objective Assessment:   80 yo male admitted from home with UTI     Action/Plan:   discharge planning   Anticipated DC Date:  10/22/2014   Anticipated DC Plan:  HOME W HOME HEALTH SERVICES      DC Planning Services  CM consult      Choice offered to / List presented to:  C-5 Sibling        HH arranged  HH-1 RN  HH-2 PT  HH-4 NURSE'S AIDE      HH agency  [facility]   Status of service:  Completed, signed off Medicare Important Message given?  YES (If response is "NO", the following Medicare IM given date fields will be blank) Date Medicare IM given:  10/22/2014 Medicare IM given by:  Ferdinand CavaSCHETTINO,Nasiah Polinsky Date Additional Medicare IM given:   Additional Medicare IM given by:    Discharge Disposition:  HOME W HOME HEALTH SERVICES  Per UR Regulation:    If discussed at Long Length of Stay Meetings, dates discussed:    Comments:  10/22/14 Ferdinand Cavandrea Schettino RN,BSN, CM [phone] Spoke with patient sister Ronnie Hood and updated her with information regarding [facility] services that were ordered and that alternative phone number for contact was  provided. Ronnie Hood verbalized understanding and had no other questions or concerns.  10/21/14 Ferdinand CavaAndrea Schettino RN, BSN, CM [phone] Spoke with the patient and his sister, Ronnie Hood, and also Ronnie Hood. The patient asked this CM to communicate with his siblings, esp Ronnie Hood, since he lives with her. Called Ronnie Hood [phone] and she stated that the patient has lived with her for the past 2 years. His local PCP is [facility] Clearance Hood [facility] and [facility] Kandra Hood. She stated that the patient has in the home a walker, shower chair, and a wheelchair. The patient also [place] [facility] [facility] [place] [place] 4 days a week for 2 hours each day. D[facility]ed the  [facility] and she stated that they are [facility], choice was offered and she wants [facility]. Will continue to follow, awaiting MD orders.

## 2014-10-24 LAB — URINE CULTURE: Colony Count: >=100000

## 2014-10-26 LAB — CULTURE, BLOOD (ROUTINE X 2)
CULTURE: NO GROWTH
Culture: NO GROWTH

## 2014-10-28 ENCOUNTER — Telehealth: Payer: Self-pay

## 2014-10-28 NOTE — Telephone Encounter (Signed)
Diane, RN w/[facility], LM on my VM asking for orders for OT to eval and treat pt at home. [facility] nursing, [facility], but think that OT therapy would also be helpful. Dr Alwyn RenHopper last saw pt in June. It looks like [facility] Dan[facility]inder[facility]kra [facility]. Called Diane back and advised that we would need to see pt for F2F visit anyway to sign any orders to satisfy Medicare requirements and that pt should come in for OV to be seen. Diane agreed and will discuss w/pt.

## 2014-10-30 ENCOUNTER — Ambulatory Visit (INDEPENDENT_AMBULATORY_CARE_PROVIDER_SITE_OTHER): Payer: Medicare Other | Admitting: Family Medicine

## 2014-10-30 VITALS — BP 130/60 | HR 64 | Temp 97.6°F | Resp 18 | Ht 68.0 in | Wt 157.8 lb

## 2014-10-30 DIAGNOSIS — Z09 Encounter for follow-up examination after completed treatment for conditions other than malignant neoplasm: Secondary | ICD-10-CM

## 2014-10-30 DIAGNOSIS — Z8673 Personal history of transient ischemic attack (TIA), and cerebral infarction without residual deficits: Secondary | ICD-10-CM

## 2014-10-30 DIAGNOSIS — R531 Weakness: Secondary | ICD-10-CM

## 2014-10-30 DIAGNOSIS — D500 Iron deficiency anemia secondary to blood loss (chronic): Secondary | ICD-10-CM

## 2014-10-30 NOTE — Progress Notes (Signed)
[facility] [address], [place] [address] [phone]  Date:  10/30/2014   Name:  Ronnie Hood   DOB:  1925-12-16   MRN:  [id number]  PCP:  No PCP Per Patient    Chief Complaint: evaluation for PT   History of Present Illness:  Ronnie Hood is a 80 y.o. very pleasant male patient who presents with the following:  Here today for evaluation- history of stroke.  He was admitted for a UTI 10 days ago.  He is supposed to have a repeat CMP and CBC.  His caregiver states he needs an order for PT; he was mostly working on leg strength.  He alos has some right arm weakness from his stroke. The date of his last stroke was 2005.   PT has been coming to the house-   Patient Active Problem List   Diagnosis Date Noted  . UTI (lower urinary tract infection) 10/20/2014  . Leukocytosis 10/20/2014  . CKD (chronic kidney disease) stage 3, GFR 30-59 ml/min 10/20/2014  . Thrombocytopenia 10/20/2014  . Anemia of chronic disease 10/20/2014  . Generalized weakness   . BPH (benign prostatic hyperplasia) 09/30/2013  . Allergic rhinitis 10/10/2012  . Hypertension 01/08/2012    Past Medical History  Diagnosis Date  . Hypertension   . Stroke     Past Surgical History  Procedure Laterality Date  . No past surgeries      History  Substance Use Topics  . Smoking status: Never Smoker   . Smokeless tobacco: Never Used  . Alcohol Use: No    History reviewed. No pertinent family history.  Allergies  Allergen Reactions  . Penicillins Rash  . Sulfa Antibiotics Rash  . Codeine Nausea Only and Other (See Comments)    Headache    Medication list has been reviewed and updated.  Current Outpatient Prescriptions on File Prior to Visit  Medication Sig Dispense Refill  . amLODipine (NORVASC) 10 MG tablet Take 5 mg by mouth daily.     Marland Kitchen. aspirin 325 MG EC tablet Take 325 mg by mouth daily.    . Cholecalciferol (VITAMIN D-3 PO) Take 1 tablet by mouth daily.    .  divalproex (DEPAKOTE ER) 500 MG 24 hr tablet Take 500 mg by mouth daily.    . ferrous sulfate 325 (65 FE) MG tablet Take 325 mg by mouth 2 (two) times daily with a meal.    . galantamine (RAZADYNE ER) 16 MG 24 hr capsule Take 16 mg by mouth daily with breakfast.    . levofloxacin (LEVAQUIN) 500 MG tablet Take 1 tablet (500 mg total) by mouth every other day. 5 tablet 0  . loratadine (ALLERGY RELIEF) 10 MG tablet Take 10 mg by mouth daily as needed for allergies.     . mometasone (NASONEX) 50 MCG/ACT nasal spray Place 2 sprays into the nose daily. 17 g 12  . omeprazole (PRILOSEC) 20 MG capsule Take 20 mg by mouth daily.    Marland Kitchen. terazosin (HYTRIN) 2 MG capsule Take 2 mg by mouth at bedtime.     No current facility-administered medications on file prior to visit.    Review of Systems:  As per HPI- otherwise negative.   Physical Examination: Filed Vitals:   10/30/14 0947  BP: 130/60  Pulse: 64  Temp: 97.6 F (36.4 C)  Resp: 18   Filed Vitals:   10/30/14 0947  Height: 5\' 8"  (1.727 m)  Weight: 157 lb 12.8 oz (  71.578 kg)   Body mass index is 24 kg/(m^2). Ideal Body Weight: Weight in (lb) to have BMI = 25: 164.1  GEN: WDWN, NAD, Non-toxic, A & O x 3, elderly gentleman in no distress.  HEENT: Atraumatic, Normocephalic. Neck supple. No masses, No LAD. Ears and Nose: No external deformity. CV: RRR, No M/G/R. No JVD. No thrill. No extra heart sounds. PULM: CTA B, no wheezes, crackles, rhonchi. No retractions. No resp. distress. No accessory muscle use. ABD: S, NT, ND, +BS. No rebound. No HSM. EXTR: No c/c/e NEURO slow gait, uses a walker PSYCH: Normally interactive. Conversant. Not depressed or anxious appearing.  Calm demeanor.  Slight decrease in strength RUE.     Assessment and Plan: Hospital discharge follow-up - Plan: CBC, Comprehensive metabolic panel  Weakness - Plan: Ambulatory referral to Physical Therapy  History of CVA (cerebrovascular accident) - Plan: Ambulatory  referral to Physical Therapy  Anemia due to chronic blood loss - Plan: CBC  Referral to PT, and did labs for follow-up from recent hospital stay  Signed Abbe AmsterdamJessica Copland, MD  [facility] has been providing his PT 2x a week

## 2014-10-30 NOTE — Patient Instructions (Signed)
We will get [facility] touch [facility] for your PT arrangements I will also be in touch with your labs.

## 2014-10-31 ENCOUNTER — Encounter: Payer: Self-pay | Admitting: Family Medicine

## 2014-10-31 LAB — COMPREHENSIVE METABOLIC PANEL
ALBUMIN: 3.1 g/dL — AB (ref 3.5–5.2)
ALK PHOS: 33 U/L — AB (ref 39–117)
AST: 23 U/L (ref 0–37)
BILIRUBIN TOTAL: 0.4 mg/dL (ref 0.2–1.2)
BUN: 17 mg/dL (ref 6–23)
CO2: 23 mEq/L (ref 19–32)
Calcium: 8.6 mg/dL (ref 8.4–10.5)
Chloride: 104 mEq/L (ref 96–112)
Creat: 1.31 mg/dL (ref 0.50–1.35)
Glucose, Bld: 86 mg/dL (ref 70–99)
POTASSIUM: 4.5 meq/L (ref 3.5–5.3)
SODIUM: 135 meq/L (ref 135–145)
TOTAL PROTEIN: 6.3 g/dL (ref 6.0–8.3)

## 2014-10-31 LAB — CBC
HCT: 30.3 % — ABNORMAL LOW (ref 39.0–52.0)
Hemoglobin: 9.9 g/dL — ABNORMAL LOW (ref 13.0–17.0)
MCH: 27.5 pg (ref 26.0–34.0)
MCHC: 32.7 g/dL (ref 30.0–36.0)
MCV: 84.2 fL (ref 78.0–100.0)
MPV: 9.2 fL — AB (ref 9.4–12.4)
PLATELETS: 294 10*3/uL (ref 150–400)
RBC: 3.6 MIL/uL — ABNORMAL LOW (ref 4.22–5.81)
RDW: 16.9 % — AB (ref 11.5–15.5)
WBC: 6.3 10*3/uL (ref 4.0–10.5)

## 2014-12-17 ENCOUNTER — Telehealth: Payer: Self-pay

## 2014-12-17 NOTE — Telephone Encounter (Signed)
Heather from [facility] would like to speak with the clinical staff regarding patient updates. Please call back at [phone].

## 2014-12-17 NOTE — Telephone Encounter (Signed)
Being d/c from advanced home care Patient has met goals No further orders for this pt She heard a brui in his right abdomen He has bee feeling  Fatigued, with intermittent nausea, worsening sob with activity X 1 week He has not wanted to leave the house over there past week

## 2014-12-17 NOTE — Telephone Encounter (Signed)
Called Heather back.  She reports that she is concerned about Herald, he has not felt like leaving the house over the last week.  He has been more tired and more SOB.  She also felt like she heard a "heartbeat" in his abdomen.  He has also complained of intermittent nausea with eating.    Called and spoke to his sister Rosey Batheresa.   She reports that Jake SharkHarold actually lives with their other sister [phone]  [address] [place] and spoke with Dewayne HatchAnn and Jake SharkHarold.  He is HOH so it is hard to communicate with him over the phone. Recommended that they have EMS come out and make sure he is ok- they are not sure if they want to do this, declined to have me call for them.  They will keep this in mind and can also come and see me in clinic if they prefer

## 2015-09-02 ENCOUNTER — Emergency Department (HOSPITAL_COMMUNITY): Payer: Medicare Other

## 2015-09-02 ENCOUNTER — Encounter (HOSPITAL_COMMUNITY): Payer: Self-pay | Admitting: Emergency Medicine

## 2015-09-02 ENCOUNTER — Ambulatory Visit: Payer: Medicare Other

## 2015-09-02 ENCOUNTER — Ambulatory Visit (INDEPENDENT_AMBULATORY_CARE_PROVIDER_SITE_OTHER): Payer: Medicare Other | Admitting: Urgent Care

## 2015-09-02 ENCOUNTER — Inpatient Hospital Stay (HOSPITAL_COMMUNITY)
Admission: EM | Admit: 2015-09-02 | Discharge: 2015-09-04 | DRG: 641 | Disposition: A | Discharge status: Home Health | Payer: Medicare Other | Attending: Family Medicine | Admitting: Family Medicine

## 2015-09-02 VITALS — BP 125/72 | HR 47 | Temp 97.9°F | Resp 18

## 2015-09-02 DIAGNOSIS — R42 Dizziness and giddiness: Secondary | ICD-10-CM | POA: Diagnosis present

## 2015-09-02 DIAGNOSIS — R0602 Shortness of breath: Secondary | ICD-10-CM

## 2015-09-02 DIAGNOSIS — N400 Enlarged prostate without lower urinary tract symptoms: Secondary | ICD-10-CM | POA: Diagnosis present

## 2015-09-02 DIAGNOSIS — N183 Chronic kidney disease, stage 3 (moderate): Secondary | ICD-10-CM | POA: Diagnosis present

## 2015-09-02 DIAGNOSIS — J9811 Atelectasis: Secondary | ICD-10-CM | POA: Diagnosis present

## 2015-09-02 DIAGNOSIS — Z885 Allergy status to narcotic agent status: Secondary | ICD-10-CM | POA: Diagnosis not present

## 2015-09-02 DIAGNOSIS — Z79899 Other long term (current) drug therapy: Secondary | ICD-10-CM | POA: Diagnosis not present

## 2015-09-02 DIAGNOSIS — F05 Delirium due to known physiological condition: Secondary | ICD-10-CM | POA: Diagnosis present

## 2015-09-02 DIAGNOSIS — R748 Abnormal levels of other serum enzymes: Secondary | ICD-10-CM | POA: Diagnosis present

## 2015-09-02 DIAGNOSIS — E875 Hyperkalemia: Principal | ICD-10-CM | POA: Diagnosis present

## 2015-09-02 DIAGNOSIS — I6930 Unspecified sequelae of cerebral infarction: Secondary | ICD-10-CM | POA: Insufficient documentation

## 2015-09-02 DIAGNOSIS — R001 Bradycardia, unspecified: Secondary | ICD-10-CM

## 2015-09-02 DIAGNOSIS — Z7982 Long term (current) use of aspirin: Secondary | ICD-10-CM

## 2015-09-02 DIAGNOSIS — Z87820 Personal history of traumatic brain injury: Secondary | ICD-10-CM | POA: Diagnosis not present

## 2015-09-02 DIAGNOSIS — I129 Hypertensive chronic kidney disease with stage 1 through stage 4 chronic kidney disease, or unspecified chronic kidney disease: Secondary | ICD-10-CM | POA: Diagnosis present

## 2015-09-02 DIAGNOSIS — Z8673 Personal history of transient ischemic attack (TIA), and cerebral infarction without residual deficits: Secondary | ICD-10-CM

## 2015-09-02 DIAGNOSIS — Z882 Allergy status to sulfonamides status: Secondary | ICD-10-CM

## 2015-09-02 DIAGNOSIS — D638 Anemia in other chronic diseases classified elsewhere: Secondary | ICD-10-CM | POA: Diagnosis present

## 2015-09-02 DIAGNOSIS — I10 Essential (primary) hypertension: Secondary | ICD-10-CM | POA: Diagnosis not present

## 2015-09-02 DIAGNOSIS — R569 Unspecified convulsions: Secondary | ICD-10-CM | POA: Diagnosis present

## 2015-09-02 DIAGNOSIS — H269 Unspecified cataract: Secondary | ICD-10-CM | POA: Diagnosis present

## 2015-09-02 DIAGNOSIS — K219 Gastro-esophageal reflux disease without esophagitis: Secondary | ICD-10-CM | POA: Diagnosis present

## 2015-09-02 DIAGNOSIS — R059 Cough, unspecified: Secondary | ICD-10-CM

## 2015-09-02 DIAGNOSIS — Z880 Allergy status to penicillin: Secondary | ICD-10-CM

## 2015-09-02 DIAGNOSIS — L989 Disorder of the skin and subcutaneous tissue, unspecified: Secondary | ICD-10-CM | POA: Diagnosis present

## 2015-09-02 DIAGNOSIS — R05 Cough: Secondary | ICD-10-CM

## 2015-09-02 DIAGNOSIS — J309 Allergic rhinitis, unspecified: Secondary | ICD-10-CM | POA: Diagnosis present

## 2015-09-02 DIAGNOSIS — R9431 Abnormal electrocardiogram [ECG] [EKG]: Secondary | ICD-10-CM

## 2015-09-02 DIAGNOSIS — F0390 Unspecified dementia without behavioral disturbance: Secondary | ICD-10-CM | POA: Diagnosis present

## 2015-09-02 DIAGNOSIS — N289 Disorder of kidney and ureter, unspecified: Secondary | ICD-10-CM | POA: Diagnosis present

## 2015-09-02 DIAGNOSIS — I6990 Unspecified sequelae of unspecified cerebrovascular disease: Secondary | ICD-10-CM | POA: Insufficient documentation

## 2015-09-02 DIAGNOSIS — I159 Secondary hypertension, unspecified: Secondary | ICD-10-CM | POA: Diagnosis not present

## 2015-09-02 HISTORY — DX: Chronic kidney disease, stage 3 unspecified: N18.30

## 2015-09-02 HISTORY — DX: Unspecified injury of head, sequela: S09.90XS

## 2015-09-02 HISTORY — DX: Benign prostatic hyperplasia without lower urinary tract symptoms: N40.0

## 2015-09-02 HISTORY — DX: Anemia in other chronic diseases classified elsewhere: D63.8

## 2015-09-02 HISTORY — DX: Accidental discharge from unspecified firearms or gun, initial encounter: W34.00XA

## 2015-09-02 HISTORY — DX: Dementia in other diseases classified elsewhere, unspecified severity, without behavioral disturbance, psychotic disturbance, mood disturbance, and anxiety: F02.80

## 2015-09-02 HISTORY — DX: Chronic kidney disease, stage 3 (moderate): N18.3

## 2015-09-02 HISTORY — DX: Puncture wound without foreign body of unspecified part of head, initial encounter: S01.93XA

## 2015-09-02 HISTORY — DX: Unspecified convulsions: R56.9

## 2015-09-02 LAB — COMPREHENSIVE METABOLIC PANEL
ALBUMIN: 3.4 g/dL — AB (ref 3.6–5.1)
ALK PHOS: 43 U/L (ref 40–115)
ALK PHOS: 45 U/L (ref 38–126)
ALT: 9 U/L (ref 9–46)
ALT: 13 U/L — ABNORMAL LOW (ref 17–63)
ANION GAP: 6 (ref 5–15)
AST: 20 U/L (ref 10–35)
AST: 25 U/L (ref 15–41)
Albumin: 3.1 g/dL — ABNORMAL LOW (ref 3.5–5.0)
BILIRUBIN TOTAL: 0.4 mg/dL (ref 0.2–1.2)
BUN: 38 mg/dL — ABNORMAL HIGH (ref 7–25)
BUN: 37 mg/dL — ABNORMAL HIGH (ref 6–20)
CALCIUM: 9.2 mg/dL (ref 8.6–10.3)
CALCIUM: 9.1 mg/dL (ref 8.9–10.3)
CO2: 22 mmol/L (ref 20–31)
CO2: 20 mmol/L — AB (ref 22–32)
Chloride: 108 mmol/L (ref 98–110)
Chloride: 109 mmol/L (ref 101–111)
Creat: 1.69 mg/dL — ABNORMAL HIGH (ref 0.70–1.11)
Creatinine, Ser: 1.78 mg/dL — ABNORMAL HIGH (ref 0.61–1.24)
GFR calc non Af Amer: 33 mL/min — ABNORMAL LOW (ref >60)
GFR, EST AFRICAN AMERICAN: 38 mL/min — AB (ref >60)
GLUCOSE: 89 mg/dL (ref 65–99)
Glucose, Bld: 88 mg/dL (ref 65–99)
POTASSIUM: 5.8 mmol/L — AB (ref 3.5–5.3)
POTASSIUM: 6.4 mmol/L — AB (ref 3.5–5.1)
SODIUM: 135 mmol/L (ref 135–145)
Sodium: 134 mmol/L — ABNORMAL LOW (ref 135–146)
TOTAL PROTEIN: 6.8 g/dL (ref 6.5–8.1)
Total Bilirubin: 0.7 mg/dL (ref 0.3–1.2)
Total Protein: 6.8 g/dL (ref 6.1–8.1)

## 2015-09-02 LAB — POCT CBC
GRANULOCYTE PERCENT: 53.1 % (ref 37–80)
HEMATOCRIT: 35.6 % — AB (ref 43.5–53.7)
Hemoglobin: 11.6 g/dL — AB (ref 14.1–18.1)
Lymph, poc: 2 (ref 0.6–3.4)
MCH, POC: 27.1 pg (ref 27–31.2)
MCHC: 32.6 g/dL (ref 31.8–35.4)
MCV: 83.1 fL (ref 80–97)
MID (cbc): 0.3 (ref 0–0.9)
MPV: 7.8 fL (ref 0–99.8)
POC GRANULOCYTE: 2.7 (ref 2–6.9)
POC LYMPH %: 40.4 % (ref 10–50)
POC MID %: 6.5 %M (ref 0–12)
Platelet Count, POC: 154 10*3/uL (ref 142–424)
RBC: 4.28 M/uL — AB (ref 4.69–6.13)
RDW, POC: 17.4 %
WBC: 5 10*3/uL (ref 4.6–10.2)

## 2015-09-02 LAB — I-STAT CHEM 8, ED
BUN: 38 mg/dL — ABNORMAL HIGH (ref 6–20)
CHLORIDE: 109 mmol/L (ref 101–111)
Calcium, Ion: 1.17 mmol/L (ref 1.13–1.30)
Creatinine, Ser: 1.8 mg/dL — ABNORMAL HIGH (ref 0.61–1.24)
Glucose, Bld: 87 mg/dL (ref 65–99)
HEMATOCRIT: 40 % (ref 39.0–52.0)
Hemoglobin: 13.6 g/dL (ref 13.0–17.0)
POTASSIUM: 6.2 mmol/L — AB (ref 3.5–5.1)
SODIUM: 136 mmol/L (ref 135–145)
TCO2: 17 mmol/L (ref 0–100)

## 2015-09-02 LAB — I-STAT TROPONIN, ED: TROPONIN I, POC: 0 ng/mL (ref 0.00–0.08)

## 2015-09-02 LAB — CBC WITH DIFFERENTIAL/PLATELET
BASOS PCT: 0 %
Basophils Absolute: 0 10*3/uL (ref 0.0–0.1)
EOS ABS: 0.1 10*3/uL (ref 0.0–0.7)
Eosinophils Relative: 2 %
HCT: 33.5 % — ABNORMAL LOW (ref 39.0–52.0)
HCT: 34.6 % — ABNORMAL LOW (ref 39.0–52.0)
HEMOGLOBIN: 11 g/dL — AB (ref 13.0–17.0)
HEMOGLOBIN: 11.2 g/dL — AB (ref 13.0–17.0)
Lymphocytes Relative: 38 %
Lymphs Abs: 1.7 10*3/uL (ref 0.7–4.0)
MCH: 27.8 pg (ref 26.0–34.0)
MCH: 27.1 pg (ref 26.0–34.0)
MCHC: 32.8 g/dL (ref 30.0–36.0)
MCHC: 32.4 g/dL (ref 30.0–36.0)
MCV: 84.6 fL (ref 78.0–100.0)
MCV: 83.8 fL (ref 78.0–100.0)
Monocytes Absolute: 0.1 10*3/uL (ref 0.1–1.0)
Monocytes Relative: 3 %
NEUTROS PCT: 57 %
Neutro Abs: 2.6 10*3/uL (ref 1.7–7.7)
Platelets: UNDETERMINED 10*3/uL (ref 150–400)
Platelets: 129 10*3/uL — ABNORMAL LOW (ref 150–400)
RBC: 3.96 MIL/uL — AB (ref 4.22–5.81)
RBC: 4.13 MIL/uL — AB (ref 4.22–5.81)
RDW: 15.6 % — ABNORMAL HIGH (ref 11.5–15.5)
RDW: 15.4 % (ref 11.5–15.5)
WBC: 2.4 10*3/uL — ABNORMAL LOW (ref 4.0–10.5)
WBC: 4.5 10*3/uL (ref 4.0–10.5)

## 2015-09-02 LAB — URINALYSIS, ROUTINE W REFLEX MICROSCOPIC
BILIRUBIN URINE: NEGATIVE
Glucose, UA: 100 mg/dL — AB
Hgb urine dipstick: NEGATIVE
KETONES UR: NEGATIVE mg/dL
Leukocytes, UA: NEGATIVE
NITRITE: NEGATIVE
Protein, ur: NEGATIVE mg/dL
Specific Gravity, Urine: 1.018 (ref 1.005–1.030)
UROBILINOGEN UA: 1 mg/dL (ref 0.0–1.0)
pH: 6 (ref 5.0–8.0)

## 2015-09-02 LAB — MRSA PCR SCREENING: MRSA BY PCR: NEGATIVE

## 2015-09-02 LAB — BASIC METABOLIC PANEL
Anion gap: 8 (ref 5–15)
BUN: 35 mg/dL — AB (ref 6–20)
CALCIUM: 8.7 mg/dL — AB (ref 8.9–10.3)
CO2: 20 mmol/L — AB (ref 22–32)
Chloride: 107 mmol/L (ref 101–111)
Creatinine, Ser: 1.84 mg/dL — ABNORMAL HIGH (ref 0.61–1.24)
GFR calc Af Amer: 36 mL/min — ABNORMAL LOW (ref >60)
GFR, EST NON AFRICAN AMERICAN: 31 mL/min — AB (ref >60)
GLUCOSE: 163 mg/dL — AB (ref 65–99)
Potassium: 5.6 mmol/L — ABNORMAL HIGH (ref 3.5–5.1)
Sodium: 135 mmol/L (ref 135–145)

## 2015-09-02 LAB — CBG MONITORING, ED: GLUCOSE-CAPILLARY: 114 mg/dL — AB (ref 65–99)

## 2015-09-02 LAB — BRAIN NATRIURETIC PEPTIDE: B NATRIURETIC PEPTIDE 5: 193.3 pg/mL — AB (ref 0.0–100.0)

## 2015-09-02 LAB — TROPONIN I

## 2015-09-02 MED ORDER — HYDRALAZINE HCL 20 MG/ML IJ SOLN: 2.0000 mg | Freq: Four times a day (QID) | INTRAMUSCULAR | Status: DC | PRN

## 2015-09-02 MED ORDER — SODIUM CHLORIDE 0.9 % IJ SOLN
3.0000 mL | Freq: Two times a day (BID) | INTRAMUSCULAR | Status: DC
  Administered 2015-09-03 – 2015-09-04 (×2): 3 mL via INTRAVENOUS

## 2015-09-02 MED ORDER — AMLODIPINE BESYLATE 5 MG PO TABS: 5.0000 mg | ORAL_TABLET | Freq: Every day | ORAL | Status: DC

## 2015-09-02 MED ORDER — ALBUTEROL SULFATE (2.5 MG/3ML) 0.083% IN NEBU
10.0000 mg | INHALATION_SOLUTION | Freq: Once | RESPIRATORY_TRACT | Status: DC
  Filled 2015-09-02: qty 12

## 2015-09-02 MED ORDER — SODIUM BICARBONATE 8.4 % IV SOLN
INTRAVENOUS | Status: AC
  Filled 2015-09-02: qty 50

## 2015-09-02 MED ORDER — FERROUS SULFATE 325 (65 FE) MG PO TABS
325.0000 mg | ORAL_TABLET | Freq: Two times a day (BID) | ORAL | Status: DC
  Administered 2015-09-03 – 2015-09-04 (×3): 325 mg via ORAL
  Filled 2015-09-02 (×3): qty 1

## 2015-09-02 MED ORDER — GALANTAMINE HYDROBROMIDE ER 8 MG PO CP24
16.0000 mg | ORAL_CAPSULE | Freq: Every day | ORAL | Status: DC
  Administered 2015-09-03 – 2015-09-04 (×2): 16 mg via ORAL
  Filled 2015-09-02 (×3): qty 2

## 2015-09-02 MED ORDER — ATROPINE SULFATE 0.1 MG/ML IJ SOLN
0.5000 mg | INTRAMUSCULAR | Status: DC | PRN
  Filled 2015-09-02 (×2): qty 10

## 2015-09-02 MED ORDER — SODIUM CHLORIDE 0.9 % IV SOLN
1.0000 g | INTRAVENOUS | Status: DC
  Filled 2015-09-02: qty 10

## 2015-09-02 MED ORDER — DEXTROSE 50 % IV SOLN: 50.0000 mL | Freq: Once | INTRAVENOUS | Status: DC

## 2015-09-02 MED ORDER — CALCIUM GLUCONATE 10 % IV SOLN
1.0000 g | Freq: Once | INTRAVENOUS | Status: AC
  Administered 2015-09-02: 1 g via INTRAVENOUS
  Filled 2015-09-02: qty 10

## 2015-09-02 MED ORDER — CALCIUM CHLORIDE 10 % IV SOLN: 1.0000 g | Freq: Once | INTRAVENOUS | Status: DC

## 2015-09-02 MED ORDER — DEXTROSE 50 % IV SOLN
50.0000 mL | Freq: Once | INTRAVENOUS | Status: AC
  Administered 2015-09-02: 50 mL via INTRAVENOUS
  Filled 2015-09-02: qty 50

## 2015-09-02 MED ORDER — ENOXAPARIN SODIUM 30 MG/0.3ML ~~LOC~~ SOLN
30.0000 mg | SUBCUTANEOUS | Status: DC
  Administered 2015-09-02: 30 mg via SUBCUTANEOUS
  Filled 2015-09-02: qty 0.3

## 2015-09-02 MED ORDER — ASPIRIN EC 325 MG PO TBEC
325.0000 mg | DELAYED_RELEASE_TABLET | Freq: Every day | ORAL | Status: DC
  Administered 2015-09-02 – 2015-09-04 (×3): 325 mg via ORAL
  Filled 2015-09-02 (×3): qty 1

## 2015-09-02 MED ORDER — PANTOPRAZOLE SODIUM 40 MG PO TBEC
40.0000 mg | DELAYED_RELEASE_TABLET | Freq: Every day | ORAL | Status: DC
  Administered 2015-09-02 – 2015-09-04 (×3): 40 mg via ORAL
  Filled 2015-09-02 (×2): qty 1

## 2015-09-02 MED ORDER — TERAZOSIN HCL 2 MG PO CAPS
2.0000 mg | ORAL_CAPSULE | Freq: Every day | ORAL | Status: DC
  Administered 2015-09-02 – 2015-09-03 (×2): 2 mg via ORAL
  Filled 2015-09-02 (×3): qty 1

## 2015-09-02 MED ORDER — DIVALPROEX SODIUM ER 500 MG PO TB24
500.0000 mg | ORAL_TABLET | Freq: Every day | ORAL | Status: DC
  Administered 2015-09-02 – 2015-09-04 (×3): 500 mg via ORAL
  Filled 2015-09-02 (×3): qty 1

## 2015-09-02 MED ORDER — ALBUTEROL (5 MG/ML) CONTINUOUS INHALATION SOLN
10.0000 mg/h | INHALATION_SOLUTION | RESPIRATORY_TRACT | Status: DC
  Administered 2015-09-02: 10 mg/h via RESPIRATORY_TRACT

## 2015-09-02 MED ORDER — SODIUM BICARBONATE 8.4 % IV SOLN
50.0000 meq | Freq: Once | INTRAVENOUS | Status: AC
  Administered 2015-09-02: 50 meq via INTRAVENOUS
  Filled 2015-09-02: qty 50

## 2015-09-02 MED ORDER — SODIUM POLYSTYRENE SULFONATE 15 GM/60ML PO SUSP
15.0000 g | Freq: Once | ORAL | Status: AC
  Administered 2015-09-02: 15 g via ORAL
  Filled 2015-09-02: qty 60

## 2015-09-02 MED ORDER — INSULIN ASPART 100 UNIT/ML ~~LOC~~ SOLN
5.0000 [IU] | Freq: Once | SUBCUTANEOUS | Status: AC
  Administered 2015-09-02: 5 [IU] via INTRAVENOUS
  Filled 2015-09-02: qty 1

## 2015-09-02 MED ORDER — SODIUM CHLORIDE 0.9 % IV SOLN
INTRAVENOUS | Status: DC
  Administered 2015-09-02: 19:00:00 via INTRAVENOUS

## 2015-09-02 NOTE — H&P (Signed)
Family Medicine Teaching [facility] Admission History and Physical Service Pager: [phone]  Patient name: Ronnie Hood Medical record number: [id number] Date of birth: 10/04/1926 Age: 81 y.o. Gender: male  Primary Care Provider: No PCP Per Patient Consultants: Cardiology Code Status: FULL  Chief Complaint: Dizziness, not feeling well  Assessment and Plan: Ronnie Hood is a 81 y.o. male presenting with hyperkalemia and bradycardia . PMH is significant for HTN, CKD III, anemia of chronic disease, hx of CVA x 3, seizures, and BPH.  1. Bradycardia: HRs were in the 30s-40s when he first got to the ED. HRs now up to 103. Possibly sick sinus syndrome. Vs some other sinus dysfunction. Difficult to ascertain what is baseline and what is secondary to his hyperkalemia.  EKG showed sinus rhythm with HR of 46 and abnormal T waves. Repeat EKG showed sinus rhythm with a HR of 83 and peaked T waves. iTroponin in the ED was 0.00.   - admit to telemetry, attending Dr. Jennette Kettle - Cardiology consult - Repeat EKG in the am - Will trend troponins x 3 - Atropine 0.5mg  IV PRN and pacing pads at the bedside - Cardiac monitoring  2. Hyperkalemia: K up to 6.4 in the ED, sample not hemolyzed. No recent changes in medications. May be secondary to his chronic kidney disease. Medications reviewed and discussed with sister with whom he lives and does not appear to be the etiology.  - Received calcium gluconate, dextrose 50, Novolog, and sodium bicarbonate in the ED - Will give Kayexylate  now.  - Will repeat BMP this evening and then monitor with daily BMPs - Will re-assess to determine if Pt needs additional doses of calcium gluconate, Novolog, or sodium bicarbonate and Kayexylate  - continue to monitor on telemetry for arrhythmias   - Repeat EKG in the am.  3. Shortness of breath: Breathing comfortably and talking in full sentences, lungs are clear on exam. CXR showing R base atelectasis. Saturations are  99% on RA. Per note from urgent care, Pt reports that his doctor at the [place] has told him he has heart failure.No echo in our system and he does not appear to be on any of they typical medicatoins. Pt also endorsing some new LE edema, although no edema is seen on exam today. Could be secondary to anxiety as well as ED RN notes he becomes panicked and endorses SOB, however becomes more calm and talkative once someone is the in room with him.  - Will order a BNP this evening - Consider ECHO in the am - currently receiving some IVFs, however continue to monitor for volume overload. - Incentive spirometry to help with the atelectasis  4. HTN: BP ranging from 137-164/58-78. - Will hold his home Norvasc, given his possible sick sinus syndrome. - Hydralazine PRN  5. CKD III: Creatinine 1.8. Baseline seems to be 1.6-1.7. - Will avoid nephrotoxic agents - Will monitor with daily BMPs  6. Anemia of chronic disease: May be secondary to his chronic kidney disease. Hgb 11.2, MCV 83.8. - Continue home med: Ferrous sulfate  bid  - Will monitor with daily CBCs  7. Hx of CVA: Pt states he had 3 CVAs in 2005. - Continue home med: ASA 325  8. Dementia: Oriented to person and place, but not time. - Continue home med: Galantamine  qd - continue to orient and encourage family visits as patient is high risk for delirium.   9. GERD: - Protonix  qd  10. History of  Seizures: Pt has not had a seizure in a few years. - Continue home med: Depakote  qd  11. BPH, stable without symptoms: urgency, dysuria, weak stream,retention. - continue home terazosin.  FEN/GI: MIVFs at 82ml/hr, heart healthy diet Prophylaxis: Lovenox   Disposition: Admitted to [facility] Medicine Teaching Service, attending Dr. Jennette Kettle.  History of Present Illness:  Ronnie Hood is a 81 y.o. male presenting from urgent care with dizziness and not feeling well since this morning. Caregiver getting ready to give him bath when he  said he didn't feel good and didn't feel like doing anything. This is unusual for him because normally he says "same old, same old" when his sister asks him how he's doing. He said nothing was hurting, but he was feeling a little dizzy. His caregiver took his blood pressure, but sister can't remember what it was. His brother came and took him to the urgent care. Per the note from urgent care, he endorsed a 1 month history of worsening shortness of breath. He was sent to the ED via EMS from the urgent care for further cardiac workup.   When we spoke with the patient in the ED, he was alone and was unable to give a good history, given his dementia. He states when he came into the ED, he was "all mixed up", but he feels better now. He was feeling tired, some dizziness this AM when he woke, felt like he was going to pass out. His HH-aide was going to help him shower and suggested he go to the urgent care.   Endorses pain on the left temporal side of his head, intermittent rhinorrhea for last couple of weeks, wanting to sleep "all the time" since 2005, increase in fatigue over the last couple of weeks, LE swelling in the last month  Denies difficulty breathing, chest pain, myalgias, abdominal pain, N/V, diarrhea, anorexia, or new medications  In the ED, he was noted to be bradycardic to 39. K was 6.4. EKG showed sinus bradycardia with a HR of 46 and biphasic T waves. For his hyperkalemia, he was treated with calcium gluconate, dextrose 50, Novolog, and sodium bicarbonate. A repeat K was 6.2. He did have an episode of shortness of breath when he became anxious. O2 sat was 100% and he had good air movement bilaterally. He was started on an hour long Albuterol neb, but it was stopped part of the way through because his breathing was fine.  Review Of Systems: Per HPI with the following additions: has bilateral cataracts. Otherwise 12 point review of systems was performed and was unremarkable.  Patient Active  Problem List   Diagnosis Date Noted  . Hyperkalemia 09/02/2015  . UTI (lower urinary tract infection) 10/20/2014  . Leukocytosis 10/20/2014  . CKD (chronic kidney disease) stage 3, GFR 30-59 ml/min 10/20/2014  . Thrombocytopenia 10/20/2014  . Anemia of chronic disease 10/20/2014  . Generalized weakness   . BPH (benign prostatic hyperplasia) 09/30/2013  . Allergic rhinitis 10/10/2012  . Hypertension 01/08/2012   Past Medical History: Past Medical History  Diagnosis Date  . Hypertension   . Stroke    Past Surgical History: Past Surgical History  Procedure Laterality Date  . No past surgeries     Social History: Social History  Substance Use Topics  . Smoking status: Never Smoker   . Smokeless tobacco: Never Used  . Alcohol Use: No   Additional social history: Has home health aides that help out. Lives at home with his sister.  Patient has a wheel chair at home. Intermittently uses it. States he holds onto the wall to ambulate when going to the bathroom at night. Also has a cane.  Please also refer to relevant sections of EMR.  Family History: No family history on file. Allergies and Medications: Allergies  Allergen Reactions  . Penicillins Rash  . Sulfa Antibiotics Rash  . Codeine Nausea Only and Other (See Comments)    Headache   No current facility-administered medications on file prior to encounter.   Current Outpatient Prescriptions on File Prior to Encounter  Medication Sig Dispense Refill  . amLODipine (NORVASC) 10 MG tablet Take 5 mg by mouth daily.     Marland Kitchen aspirin 325 MG EC tablet Take 325 mg by mouth daily.    . Cholecalciferol (VITAMIN D-3 PO) Take 1 tablet by mouth daily.    . divalproex (DEPAKOTE ER) 500 MG 24 hr tablet Take 500 mg by mouth daily.    . ferrous sulfate 325 (65 FE) MG tablet Take 325 mg by mouth 2 (two) times daily with a meal.    . galantamine (RAZADYNE ER) 16 MG 24 hr capsule Take 16 mg by mouth daily with breakfast.    . loratadine  (ALLERGY RELIEF) 10 MG tablet Take 10 mg by mouth daily as needed for allergies.     . mometasone (NASONEX) 50 MCG/ACT nasal spray Place 2 sprays into the nose daily. 17 g 12  . omeprazole (PRILOSEC) 20 MG capsule Take 20 mg by mouth daily.    Marland Kitchen terazosin (HYTRIN) 2 MG capsule Take 2 mg by mouth at bedtime.      Objective: BP 154/70 mmHg  Pulse 40  Temp(Src) 97.6 F (36.4 C) (Oral)  Resp 15  SpO2 99% Exam: General: Elderly man laying in bed, in NAD; very conversant- reminiscing about past times. Eyes: PERRLA, EOMI, no scleral icterus ENTM: Oropharynx clear, MMM Poor dentition, dentures on top.  Neck: Supple, no cervical lymphadenopathy Cardiovascular: Regular rhythm except for one irregular beat, bradycardic, no m/r/g Respiratory: CTAB, normal work of breathing, speaking in full sentences, normal work of breathing Abdomen: +BS, soft, non-tender, non-distended MSK: No LE edema, 5/5 muscle strength in upper and lower extremities bilaterally Skin: Two circular wounds present that are both ~7mm located on the back Neuro: Awake, alert, oriented to person and self but not to time place, president, CN 2-12 grossly intact. Psych: Appropriate mood and affect.  Labs and Imaging: CBC BMET   Recent Labs Lab 09/02/15 1431  WBC 4.5  HGB 11.2*  HCT 34.6*  PLT 129*    Recent Labs Lab 09/02/15 1300 09/02/15 1306  NA 135 136  K 6.4* 6.2*  CL 109 109  CO2 20*  --   BUN 37* 38*  CREATININE 1.78* 1.80*  GLUCOSE 88 87  CALCIUM 9.1  --      ITrop: 0.00  EKG (9/28): Sinus bradycardia with HR of 46, biphasic T waves in II and V4. T wave inversions III, V5 and V6.  CXR (9/28): right base atelectasis   Campbell Stall, MD 09/02/2015, 3:32 PM PGY-1, [facility] Family Medicine FPTS Intern pager: [phone], text pages welcome  Upper Level Addendum:  I have seen and evaluated this patient along with Dr. Nancy Marus and reviewed the above note, making necessary revisions in purple.   Joanna Puff, MD [facility] Family Medicine Resident, PGY-2

## 2015-09-02 NOTE — ED Provider Notes (Signed)
CSN: [id number]     Arrival date & time 09/02/15  1243 History   First MD Initiated Contact with Patient 09/02/15 1252     Chief Complaint  Patient presents with  . Bradycardia      The history is provided by the patient and the EMS personnel. No language interpreter was used.   [name] presents for evaluation of dizziness.  Level V caveat due to confusion.  He presents by EMS from urgent care for dizziness, generalized weakness (greater on right) - unclear if for one or three days.  He denies chest pain, fevers, change in oral intake, vomiting, diarrhea.  He endorses SOB, nausea.  No recent medication changes.   Past Medical History  Diagnosis Date  . Hypertension   . Stroke    Past Surgical History  Procedure Laterality Date  . No past surgeries     No family history on file. Social History  Substance Use Topics  . Smoking status: Never Smoker   . Smokeless tobacco: Never Used  . Alcohol Use: No    Review of Systems  All other systems reviewed and are negative.     Allergies  Penicillins; Sulfa antibiotics; and Codeine  Home Medications   Prior to Admission medications   Medication Sig Start Date End Date Taking? Authorizing Provider  amLODipine (NORVASC) 10 MG tablet Take 5 mg by mouth daily.     Historical Provider, MD  aspirin 325 MG EC tablet Take 325 mg by mouth daily.    Historical Provider, MD  Cholecalciferol (VITAMIN D-3 PO) Take 1 tablet by mouth daily.    Historical Provider, MD  divalproex (DEPAKOTE ER) 500 MG 24 hr tablet Take 500 mg by mouth daily.    Historical Provider, MD  ferrous sulfate 325 (65 FE) MG tablet Take 325 mg by mouth 2 (two) times daily with a meal.    Historical Provider, MD  galantamine (RAZADYNE ER) 16 MG 24 hr capsule Take 16 mg by mouth daily with breakfast.    Historical Provider, MD  loratadine (ALLERGY RELIEF) 10 MG tablet Take 10 mg by mouth daily as needed for allergies.     Historical Provider, MD  mometasone (NASONEX)  50 MCG/ACT nasal spray Place 2 sprays into the nose daily. 10/10/12   Elvina Sidle, MD  omeprazole (PRILOSEC) 20 MG capsule Take 20 mg by mouth daily.    Historical Provider, MD  terazosin (HYTRIN) 2 MG capsule Take 2 mg by mouth at bedtime.    Historical Provider, MD   BP 182/66 mmHg  Pulse 45  Temp(Src) 97.6 F (36.4 C) (Oral)  Resp 16  SpO2 100% Physical Exam  Constitutional: He appears well-developed and well-nourished.  HENT:  Head: Normocephalic and atraumatic.  Cardiovascular: Regular rhythm.   No murmur heard. bradycardic  Pulmonary/Chest: Effort normal and breath sounds normal. No respiratory distress.  Abdominal: Soft. There is no tenderness. There is no rebound and no guarding.  Musculoskeletal: He exhibits no edema or tenderness.  Neurological: He is alert.  Mildly confused  Skin: Skin is warm and dry.  Psychiatric:  tangential thought process, difficult to redirect at times.    Nursing note and vitals reviewed.   ED Course  Procedures (including critical care time) CRITICAL CARE Performed by: Tilden Fossa   Total critical care time: 30 minutes  Critical care time was exclusive of separately billable procedures and treating other patients.  Critical care was necessary to treat or prevent imminent or life-threatening deterioration.  Critical  care was time spent personally by me on the following activities: development of treatment plan with patient and/or surrogate as well as nursing, discussions with consultants, evaluation of patient's response to treatment, examination of patient, obtaining history from patient or surrogate, ordering and performing treatments and interventions, ordering and review of laboratory studies, ordering and review of radiographic studies, pulse oximetry and re-evaluation of patient's condition.  Labs Review Labs Reviewed  COMPREHENSIVE METABOLIC PANEL - Abnormal; Notable for the following:    Potassium 6.4 (*)    CO2 20 (*)     BUN 37 (*)    Creatinine, Ser 1.78 (*)    Albumin 3.1 (*)    ALT 13 (*)    GFR calc non Af Amer 33 (*)    GFR calc Af Amer 38 (*)    All other components within normal limits  CBC WITH DIFFERENTIAL/PLATELET - Abnormal; Notable for the following:    WBC 2.4 (*)    RBC 3.96 (*)    Hemoglobin 11.0 (*)    HCT 33.5 (*)    RDW 15.6 (*)    All other components within normal limits  CBC WITH DIFFERENTIAL/PLATELET - Abnormal; Notable for the following:    RBC 4.13 (*)    Hemoglobin 11.2 (*)    HCT 34.6 (*)    Platelets 129 (*)    All other components within normal limits  I-STAT CHEM 8, ED - Abnormal; Notable for the following:    Potassium 6.2 (*)    BUN 38 (*)    Creatinine, Ser 1.80 (*)    All other components within normal limits  CBG MONITORING, ED - Abnormal; Notable for the following:    Glucose-Capillary 114 (*)    All other components within normal limits  MRSA PCR SCREENING  URINALYSIS, ROUTINE W REFLEX MICROSCOPIC (NOT AT [facility])  TROPONIN I  TROPONIN I  TROPONIN I  BASIC METABOLIC PANEL  CBC  BASIC METABOLIC PANEL  Rosezena Sensor, ED    Imaging Review Dg Chest Port 1 View  09/02/2015   CLINICAL DATA:  Bradycardia, shortness of breath.  EXAM: PORTABLE CHEST 1 VIEW  COMPARISON:  None.  FINDINGS: Right base atelectasis. Left lung is clear. Heart is normal size. No effusions. No acute bony abnormality.  IMPRESSION: Right base atelectasis.   Electronically Signed   By: Charlett Nose M.D.   On: 09/02/2015 14:17   I have personally reviewed and evaluated these images and lab results as part of my medical decision-making.   EKG Interpretation   Date/Time:  Wednesday September 02 2015 12:49:22 EDT Ventricular Rate:  46 PR Interval:  170 QRS Duration: 82 QT Interval:  451 QTC Calculation: 394 R Axis:   49 Text Interpretation:  Sinus bradycardia Abnormal T, consider ischemia,  diffuse leads Confirmed by Lincoln Brigham [phone]) on 09/02/2015 12:58:56 PM      MDM    Final diagnoses:  Bradycardia  Hyperkalemia    Pt here for dizziness, nausea, bradycardia.  EKG with sinus bradycardia, TWI inferolaterally.  Pt is confused but per family near his baseline.  BMP with hyperkalemia - treated with improvement in his heart rate to 70s and pt reports feeling improved.    He did develop an episode if SOB with improved heart rate (90s) - on repeat exam pt is anxious, SpO2 100%, good air movement bilaterally, FSBS wnl.  Pt improved without intervention.   D/w Family Practice resident service regarding admission for further management.      Lanora Manis  Madilyn Hook, MD 09/02/15 [phone]

## 2015-09-02 NOTE — Patient Instructions (Signed)
Bradycardia °Bradycardia is a term for a heart rate (pulse) that, in adults, is slower than 60 beats per minute. A normal rate is 60 to 100 beats per minute. A heart rate below 60 beats per minute may be normal for some adults with healthy hearts. If the rate is too slow, the heart may have trouble pumping the volume of blood the body needs. If the heart rate gets too low, blood flow to the brain may be decreased and may make you feel lightheaded, dizzy, or faint. °The heart has a natural pacemaker in the top of the heart called the SA node (sinoatrial or sinus node). This pacemaker sends out regular electrical signals to the muscle of the heart, telling the heart muscle when to beat (contract). The electrical signal travels from the upper parts of the heart (atria) through the AV node (atrioventricular node), to the lower chambers of the heart (ventricles). The ventricles squeeze, pumping the blood from your heart to your lungs and to the rest of your body. °CAUSES  °· Problem with the heart's electrical system. °· Problem with the heart's natural pacemaker. °· Heart disease, damage, or infection. °· Medications. °· Problems with minerals and salts (electrolytes). °SYMPTOMS  °· Fainting (syncope). °· Fatigue and weakness. °· Shortness of breath (dyspnea). °· Chest pain (angina). °· Drowsiness. °· Confusion. °DIAGNOSIS  °· An electrocardiogram (ECG) can help your caregiver determine the type of slow heart rate you have. °· If the cause is not seen on an ECG, you may need to wear a heart monitor that records your heart rhythm for several hours or days. °· Blood tests. °TREATMENT  °· Electrolyte supplements. °· Medications. °· Withholding medication which is causing a slow heart rate. °· Pacemaker placement. °SEEK IMMEDIATE MEDICAL CARE IF:  °· You feel lightheaded or faint. °· You develop an irregular heart rate. °· You feel chest pain or have trouble breathing. °MAKE SURE YOU:  °· Understand these  instructions. °· Will watch your condition. °· Will get help right away if you are not doing well or get worse. °Document Released: 08/13/2002 Document Revised: 02/13/2012 Document Reviewed: 02/26/2014 °Exit[employer]® Patient Information ©2015 [employer]. This information is not intended to replace advice given to you by your health care provider. Make sure you discuss any questions you have with your health care provider. ° °

## 2015-09-02 NOTE — ED Notes (Signed)
Pt placed in gown and in bed. Pt monitored by pulse ox, bp cuff, and 12-lead. 

## 2015-09-02 NOTE — ED Notes (Signed)
Sent from [facility]-- with c/o malaise, fatigue for 3 days. Transferred per [facility]. Pt talking nonstop-- no shortness of breath noted.

## 2015-09-02 NOTE — ED Notes (Signed)
Continuous neb stopped per Dr. Madilyn Hook, due to possible concerns of adverse reaction.

## 2015-09-02 NOTE — Progress Notes (Signed)
Call Pager [phone] for any questions or notifications regarding this patient  FMTS Attending Brief Admission Note: Denny Levy MD Attending pager:[phone] I  have seen and examined this patient, reviewed their chart. I have discussed this patient with the resident. We are in the process of admitting this patient. Upon discussion with the emergency room physician, we were informed that he had an episode of extreme agitation and confusion prior to our arrival in the emergency department. This was transient. The emergency doctor ordered a stat blood gas but by the time respiratory therapy arrived, he had returned to baseline status and was speaking normally with me.   I have briefly reviewed his EKG from earlier with his heart rate at 46. The combination of these EKG findings and those of a EKG done later that showed a sinus rhythm with normal rate concern me for episodic sinus dysfunction. Certainly the46 heart rate consistent with a junctional escape or ventricular rhythm. I wonder if this (arrhythmia) coincides with his episodes of confusion and agitation and SOB. We will ask cardiology to evaluate him tonight and have placed him on telemetry. We are also going to rule out for MI by serial enzymes. Full H&P to follow.

## 2015-09-02 NOTE — Progress Notes (Signed)
    MRN: [id number] DOB: Mar 15, 1926  Subjective:   Ronnie Hood is a 81 y.o. male with pmh of HTN, stroke presenting for chief complaint of Dizziness; Fatigue; and Nasal Congestion  Reports 1 month history of intermittent cough and worsening shortness of breath. He also had some left-sided temporal head pain last night which is resolved today. Admit to long-standing weakness which has been unchanged for the past month. Patient is nonsmoker. Denies fever, chest pain, heart racing, palpitations, nausea, vomiting, dizziness, abdominal pain, lower leg swelling. Patient is managed through the [place] and reports that his doctor has told him he has heart failure. Denies any other aggravating or relieving factors, no other questions or concerns.  Ronnie Hood has a current medication list which includes the following prescription(s): amlodipine, aspirin, cholecalciferol, divalproex, ferrous sulfate, galantamine, loratadine, mometasone, omeprazole, and terazosin. Also is allergic to penicillins; sulfa antibiotics; and codeine.  Ronnie Hood  has a past medical history of Hypertension and Stroke. Also  has past surgical history that includes No past surgeries.  Objective:   Vitals: BP 125/72 mmHg  Pulse 47  Temp(Src) 97.9 F (36.6 C) (Oral)  Resp 18  SpO2 98%  Physical Exam  Constitutional: He is oriented to person, place, and time. He appears well-developed and well-nourished.  HENT:  Mouth/Throat: Oropharynx is clear and moist.  Eyes: Pupils are equal, round, and reactive to light.  Cardiovascular: Regular rhythm and intact distal pulses.  Bradycardia present.  Exam reveals no gallop and no friction rub.   No murmur heard. Pulmonary/Chest: No respiratory distress. He has no wheezes. He has no rales.  Abdominal: Soft. Bowel sounds are normal. He exhibits no distension and no mass. There is no tenderness.  Neurological: He is alert and oriented to person, place, and time. No cranial nerve deficit.  Skin:  Skin is warm and dry. No rash noted. No erythema. No pallor.   ECG interpretation by Dr. Cleta Alberts and PA-Mani - Patient has new t-wave inversion in AVF and V3, bradycardia (43).  Results for orders placed or performed in visit on 09/02/15 (from the past 24 hour(s))  POCT CBC     Status: Abnormal   Collection Time: 09/02/15 11:45 AM  Result Value Ref Range   WBC 5.0 4.6 - 10.2 K/uL   Lymph, poc 2.0 0.6 - 3.4   POC LYMPH PERCENT 40.4 10 - 50 %L   MID (cbc) 0.3 0 - 0.9   POC MID % 6.5 0 - 12 %M   POC Granulocyte 2.7 2 - 6.9   Granulocyte percent 53.1 37 - 80 %G   RBC 4.28 (A) 4.69 - 6.13 M/uL   Hemoglobin 11.6 (A) 14.1 - 18.1 g/dL   HCT, POC 40.9 (A) 81.1 - 53.7 %   MCV 83.1 80 - 97 fL   MCH, POC 27.1 27 - 31.2 pg   MCHC 32.6 31.8 - 35.4 g/dL   RDW, POC 91.4 %   Platelet Count, POC 154.0 142 - 424 K/uL   MPV 7.8 0 - 99.8 fL   Assessment and Plan :   1. Shortness of breath 2. Cough - Will send to ED via EMS for further cardiac work-up. Patient may be undergoing cardiopulmonary process including heart failure, ischemia, pneumonia. No changes were made to patient's medication regimen.  Wallis Bamberg, PA-C [facility] [phone] 09/02/2015 12:18 PM

## 2015-09-02 NOTE — Consult Note (Signed)
Cardiologist: New-Croitoru  Reason for Consult: Bradycardia Referring Physician:   [name] is an 81 y.o. male.  HPI:   The patient is an 81 year old male with a history of hypertension, chronic kidney disease, anemia of chronic disease, hypertension, GERD, dementia, seizures and CVA(2005).  He presented with dizziness and generalized weakness.  We are asked to see for bradycardia.  He was not on any beta blocker at home.   He reports feeling a little dizzy today and his HR was slow when he was at [facility] urgent care.  The patient currently denies nausea, vomiting, fever, chest pain, shortness of breath, orthopnea, PND, cough, congestion, abdominal pain.      Past Medical History  Diagnosis Date  . Hypertension   . Stroke     Past Surgical History  Procedure Laterality Date  . No past surgeries      Family history:  Unable to obtain.    Social History:  reports that he has never smoked. He has never used smokeless tobacco. He reports that he does not drink alcohol or use illicit drugs.  Allergies:  Allergies  Allergen Reactions  . Penicillins Rash  . Sulfa Antibiotics Rash  . Codeine Nausea Only and Other (See Comments)    Headache    Medications: Scheduled Meds: . aspirin  325 mg Oral Daily  . divalproex  500 mg Oral Daily  . enoxaparin (LOVENOX) injection  30 mg Subcutaneous Q24H  . [START ON 09/03/2015] ferrous sulfate  325 mg Oral BID WC  . [START ON 09/03/2015] galantamine  16 mg Oral Q breakfast  . pantoprazole  40 mg Oral Daily  . sodium chloride  3 mL Intravenous Q12H  . sodium polystyrene  15 g Oral Once  . terazosin  2 mg Oral QHS   Continuous Infusions: . sodium chloride 75 mL/hr at 09/02/15 1837  . albuterol Stopped (09/02/15 1546)   PRN Meds:.atropine, hydrALAZINE   Results for orders placed or performed during the hospital encounter of 09/02/15 (from the past 48 hour(s))  Comprehensive metabolic panel     Status: Abnormal   Collection  Time: 09/02/15  1:00 PM  Result Value Ref Range   Sodium 135 135 - 145 mmol/L   Potassium 6.4 (HH) 3.5 - 5.1 mmol/L    Comment: NO VISIBLE HEMOLYSIS CRITICAL RESULT CALLED TO, READ BACK BY AND VERIFIED WITH: CHELSY CARLAN,RN AT 1344 09/02/15 BY ZBEECH.    Chloride 109 101 - 111 mmol/L   CO2 20 (L) 22 - 32 mmol/L   Glucose, Bld 88 65 - 99 mg/dL   BUN 37 (H) 6 - 20 mg/dL   Creatinine, Ser 1.78 (H) 0.61 - 1.24 mg/dL   Calcium 9.1 8.9 - 10.3 mg/dL   Total Protein 6.8 6.5 - 8.1 g/dL   Albumin 3.1 (L) 3.5 - 5.0 g/dL   AST 25 15 - 41 U/L   ALT 13 (L) 17 - 63 U/L   Alkaline Phosphatase 45 38 - 126 U/L   Total Bilirubin 0.7 0.3 - 1.2 mg/dL   GFR calc non Af Amer 33 (L) >60 mL/min   GFR calc Af Amer 38 (L) >60 mL/min    Comment: (NOTE) The eGFR has been calculated using the CKD EPI equation. This calculation has not been validated in all clinical situations. eGFR's persistently <60 mL/min signify possible Chronic Kidney Disease.    Anion gap 6 5 - 15  CBC with Differential     Status: Abnormal   Collection Time:  09/02/15  1:00 PM  Result Value Ref Range   WBC 2.4 (L) 4.0 - 10.5 K/uL    Comment: QUESTIONABLE RESULTS, RECOMMEND RECOLLECT TO VERIFY NOTIFIED BAST, TRACY RN 1350 09/02/2015 BY MACEDA, J    RBC 3.96 (L) 4.22 - 5.81 MIL/uL    Comment: QUESTIONABLE RESULTS, RECOMMEND RECOLLECT TO VERIFY   Hemoglobin 11.0 (L) 13.0 - 17.0 g/dL    Comment: QUESTIONABLE RESULTS, RECOMMEND RECOLLECT TO VERIFY   HCT 33.5 (L) 39.0 - 52.0 %    Comment: QUESTIONABLE RESULTS, RECOMMEND RECOLLECT TO VERIFY   MCV 84.6 78.0 - 100.0 fL    Comment: QUESTIONABLE RESULTS, RECOMMEND RECOLLECT TO VERIFY   MCH 27.8 26.0 - 34.0 pg    Comment: QUESTIONABLE RESULTS, RECOMMEND RECOLLECT TO VERIFY   MCHC 32.8 30.0 - 36.0 g/dL    Comment: QUESTIONABLE RESULTS, RECOMMEND RECOLLECT TO VERIFY   RDW 15.6 (H) 11.5 - 15.5 %    Comment: QUESTIONABLE RESULTS, RECOMMEND RECOLLECT TO VERIFY   Platelets PLATELET CLUMPS  NOTED ON SMEAR, UNABLE TO ESTIMATE 150 - 400 K/uL    Comment: QUESTIONABLE RESULTS, RECOMMEND RECOLLECT TO VERIFY NOTIFIED BAST, TRACY RN 1350 09/02/2015 BY MACEDA, J   I-stat troponin, ED     Status: None   Collection Time: 09/02/15  1:05 PM  Result Value Ref Range   Troponin i, poc 0.00 0.00 - 0.08 ng/mL   Comment 3            Comment: Due to the release kinetics of cTnI, a negative result within the first hours of the onset of symptoms does not rule out myocardial infarction with certainty. If myocardial infarction is still suspected, repeat the test at appropriate intervals.   I-stat Chem 8, ED     Status: Abnormal   Collection Time: 09/02/15  1:06 PM  Result Value Ref Range   Sodium 136 135 - 145 mmol/L   Potassium 6.2 (HH) 3.5 - 5.1 mmol/L   Chloride 109 101 - 111 mmol/L   BUN 38 (H) 6 - 20 mg/dL   Creatinine, Ser 8.59 (H) 0.61 - 1.24 mg/dL   Glucose, Bld 87 65 - 99 mg/dL   Calcium, Ion 4.39 6.21 - 1.30 mmol/L   TCO2 17 0 - 100 mmol/L   Hemoglobin 13.6 13.0 - 17.0 g/dL   HCT 81.9 68.2 - 79.9 %   Comment NOTIFIED PHYSICIAN   CBC with Differential     Status: Abnormal   Collection Time: 09/02/15  2:31 PM  Result Value Ref Range   WBC 4.5 4.0 - 10.5 K/uL   RBC 4.13 (L) 4.22 - 5.81 MIL/uL   Hemoglobin 11.2 (L) 13.0 - 17.0 g/dL   HCT 65.7 (L) 38.0 - 13.0 %   MCV 83.8 78.0 - 100.0 fL   MCH 27.1 26.0 - 34.0 pg   MCHC 32.4 30.0 - 36.0 g/dL   RDW 17.5 12.0 - 47.3 %   Platelets 129 (L) 150 - 400 K/uL   Neutrophils Relative % 57 %   Neutro Abs 2.6 1.7 - 7.7 K/uL   Lymphocytes Relative 38 %   Lymphs Abs 1.7 0.7 - 4.0 K/uL   Monocytes Relative 3 %   Monocytes Absolute 0.1 0.1 - 1.0 K/uL   Eosinophils Relative 2 %   Eosinophils Absolute 0.1 0.0 - 0.7 K/uL   Basophils Relative 0 %   Basophils Absolute 0.0 0.0 - 0.1 K/uL  CBG monitoring, ED     Status: Abnormal   Collection Time: 09/02/15  3:32 PM  Result Value Ref Range   Glucose-Capillary 114 (H) 65 - 99 mg/dL    Dg  Chest Port 1 View  09/02/2015   CLINICAL DATA:  Bradycardia, shortness of breath.  EXAM: PORTABLE CHEST 1 VIEW  COMPARISON:  None.  FINDINGS: Right base atelectasis. Left lung is clear. Heart is normal size. No effusions. No acute bony abnormality.  IMPRESSION: Right base atelectasis.   Electronically Signed   By: Rolm Baptise M.D.   On: 09/02/2015 14:17    Review of Systems  Constitutional: Negative for fever.  HENT: Negative for congestion.   Respiratory: Negative for cough and shortness of breath.   Cardiovascular: Negative for chest pain, orthopnea and leg swelling.  Gastrointestinal: Negative for nausea, vomiting and abdominal pain.  Neurological: Positive for dizziness.  All other systems reviewed and are negative.  Blood pressure 143/78, pulse 95, temperature 97.9 F (36.6 C), temperature source Oral, resp. rate 13, height 6' (1.829 m), weight 162 lb 11.2 oz (73.8 kg), SpO2 99 %. Physical Exam  Nursing note and vitals reviewed. Constitutional: He is oriented to person, place, and time. He appears well-developed and well-nourished. No distress.  HENT:  Head: Normocephalic and atraumatic.  Mouth/Throat: No oropharyngeal exudate.  Eyes: EOM are normal. Pupils are equal, round, and reactive to light. No scleral icterus.  Neck: Normal range of motion. Neck supple. No JVD present.  Cardiovascular: Normal rate, regular rhythm, S1 normal and S2 normal.   No murmur heard. Pulses:      Radial pulses are 2+ on the right side, and 2+ on the left side.       Dorsalis pedis pulses are 2+ on the right side, and 2+ on the left side.  No carotid bruit  Respiratory: Effort normal and breath sounds normal. He has no wheezes. He has no rales.  GI: Soft. Bowel sounds are normal. He exhibits no distension. There is no tenderness.  Musculoskeletal: He exhibits no edema.  Neurological: He is alert and oriented to person, place, and time. He exhibits normal muscle tone.  Skin: Skin is warm and dry.    Psychiatric: He has a normal mood and affect.    Assessment/Plan: Active Problems:   Hyperkalemia   bradycardia  Plan:   Give kayexalate now.  Once potassium is corrected, I suspect his HR will stabilize.  Continue to monitor on tele.  ? Etiology of hyperkalemia/  Tarri Fuller, PAC 09/02/2015, 6:49 PM     I have seen and examined the patient along with HAGER, BRYAN, PAC.  I have reviewed the chart, notes and new data.  I agree with PA's note.  Key new complaints: asymptomatic right now; apparently dyspneic and confused when he was bradycardic Key examination changes: normal cardiovascular exam Key new findings / data: his bradycardia seems to correlate with the elevated K level; renal function is similar to his baseline.  PLAN: Although advanced age and galantamine side effect may be contributing to the bradycardia, the cause is clearly the hyperkalemia. The treatment he has received so far will only temporarily correct the effects of the elevated K and cause a transient reduction in serum K. Only after he receives the kayexelate will he have a durable reduction in K level. Furosemide and IV saline is another reasonable and faster option, but less appealing due to his kidney problem.  The cause of his hyperkalemia is more elusive. He has renal insufficiency, but creatinine has been pretty stable close to current level for several years.  Note that he receives medications from the [facility] hospital and it is possible that we may not have a complete or accurate list of meds. As recently as last November, lisinopril, losartan and triamterene-HCTZ were stopped for acute on chronic renal failure. It is conceivable that he is getting one or more RAAS inhibitors at home, without our knowledge.  Anticipate that the bradycardia will cease to be a problem after his K level is corrected. Pacemaker therapy is not necessary.  Sanda Klein, MD, [place] [phone] 09/02/2015, 7:48 PM

## 2015-09-03 ENCOUNTER — Encounter (HOSPITAL_COMMUNITY): Payer: Self-pay | Admitting: Student

## 2015-09-03 DIAGNOSIS — I159 Secondary hypertension, unspecified: Secondary | ICD-10-CM

## 2015-09-03 LAB — CBC
HCT: 35 % — ABNORMAL LOW (ref 39.0–52.0)
Hemoglobin: 11.6 g/dL — ABNORMAL LOW (ref 13.0–17.0)
MCH: 28 pg (ref 26.0–34.0)
MCHC: 33.1 g/dL (ref 30.0–36.0)
MCV: 84.3 fL (ref 78.0–100.0)
Platelets: 127 10*3/uL — ABNORMAL LOW (ref 150–400)
RBC: 4.15 MIL/uL — ABNORMAL LOW (ref 4.22–5.81)
RDW: 15.7 % — AB (ref 11.5–15.5)
WBC: 6.2 10*3/uL (ref 4.0–10.5)

## 2015-09-03 LAB — BASIC METABOLIC PANEL
Anion gap: 9 (ref 5–15)
Anion gap: 6 (ref 5–15)
BUN: 32 mg/dL — AB (ref 6–20)
BUN: 28 mg/dL — AB (ref 6–20)
CALCIUM: 8.4 mg/dL — AB (ref 8.9–10.3)
CALCIUM: 8.9 mg/dL (ref 8.9–10.3)
CO2: 18 mmol/L — ABNORMAL LOW (ref 22–32)
CO2: 22 mmol/L (ref 22–32)
CREATININE: 1.66 mg/dL — AB (ref 0.61–1.24)
Chloride: 107 mmol/L (ref 101–111)
Chloride: 109 mmol/L (ref 101–111)
Creatinine, Ser: 1.62 mg/dL — ABNORMAL HIGH (ref 0.61–1.24)
GFR calc Af Amer: 42 mL/min — ABNORMAL LOW (ref >60)
GFR calc non Af Amer: 35 mL/min — ABNORMAL LOW (ref >60)
GFR, EST AFRICAN AMERICAN: 41 mL/min — AB (ref >60)
GFR, EST NON AFRICAN AMERICAN: 37 mL/min — AB (ref >60)
GLUCOSE: 82 mg/dL (ref 65–99)
Glucose, Bld: 118 mg/dL — ABNORMAL HIGH (ref 65–99)
Potassium: 6.1 mmol/L (ref 3.5–5.1)
Potassium: 4.9 mmol/L (ref 3.5–5.1)
SODIUM: 137 mmol/L (ref 135–145)
Sodium: 134 mmol/L — ABNORMAL LOW (ref 135–145)

## 2015-09-03 LAB — TROPONIN I
TROPONIN I: 0.06 ng/mL — AB (ref <0.031)
TROPONIN I: 0.05 ng/mL — AB (ref <0.031)

## 2015-09-03 LAB — BRAIN NATRIURETIC PEPTIDE: BRAIN NATRIURETIC PEPTIDE: 180.2 pg/mL — AB (ref 0.0–100.0)

## 2015-09-03 MED ORDER — ENOXAPARIN SODIUM 40 MG/0.4ML ~~LOC~~ SOLN
40.0000 mg | SUBCUTANEOUS | Status: DC
  Administered 2015-09-03: 40 mg via SUBCUTANEOUS
  Filled 2015-09-03: qty 0.4

## 2015-09-03 MED ORDER — HYDROCHLOROTHIAZIDE 25 MG PO TABS
25.0000 mg | ORAL_TABLET | Freq: Every day | ORAL | Status: DC
  Filled 2015-09-03: qty 1

## 2015-09-03 MED ORDER — AMLODIPINE BESYLATE 5 MG PO TABS
5.0000 mg | ORAL_TABLET | Freq: Every day | ORAL | Status: DC
  Administered 2015-09-03 – 2015-09-04 (×2): 5 mg via ORAL
  Filled 2015-09-03 (×2): qty 1
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
## 2015-09-03 NOTE — Progress Notes (Signed)
CRITICAL VALUE ALERT  Critical value received:  K 6.1  Date of notification:  09/03/15  Time of notification:  0615  Critical value read back:Yes.    Nurse who received alert:  Alycia Rossetti  MD notified (1st page):  Family Med  Time of first page: 0620   Responding MD:  Family Med  Time MD responded: [phone]

## 2015-09-03 NOTE — Progress Notes (Signed)
Patient Name: Ronnie Hood Date of Encounter: 09/03/2015  Active Problems:   Hypertension   Hyperkalemia   Bradycardia   Acute confusional state   Shortness of breath   Renal insufficiency   Late effects of CVA (cerebrovascular accident)   Primary Cardiologist: Dr Royann Shivers  Patient Profile: 81 yo male w/ hx HTN, CKD, anemia, HTN, GERD, CVA, dementia and seizures. Admitted 09/28 w/ hyperkalemia, cards seeing for bradycardia.  SUBJECTIVE: No chest pain, no SOB. Feels better than yesterday.   OBJECTIVE Filed Vitals:   09/03/15 0400 09/03/15 0408 09/03/15 0718 09/03/15 0800  BP: 151/61  170/70 180/78  Pulse: 51  55 59  Temp:  98 F (36.7 C) 98.4 F (36.9 C)   TempSrc:  Oral Oral   Resp: Height:      Weight:      SpO2: 98%  99% 98%    Intake/Output Summary (Last 24 hours) at 09/03/15 0924 Last data filed at 09/03/15 0600  Gross per 24 hour  Intake 836.67 ml  Output    415 ml  Net 421.67 ml   Filed Weights   09/02/15 1734  Weight: 162 lb 11.2 oz (73.8 kg)    PHYSICAL EXAM General: Well developed, well nourished, male in no acute distress. Head: Normocephalic, atraumatic.  Neck: Supple without bruits, JVD not elevated. Lungs:  Resp regular and unlabored, few rales, good air exchange. Heart: RRR, S1, S2, no S3, S4, or murmur; no rub. Abdomen: Soft, non-tender, non-distended, BS + x 4.  Extremities: No clubbing, cyanosis, edema.  Neuro: Alert and oriented X 2. Moves all extremities spontaneously. Psych: Normal affect.  LABS: CBC:  Recent Labs  09/02/15 1431 09/03/15 0710  WBC 4.5 6.2  NEUTROABS 2.6  --   HGB 11.2* 11.6*  HCT 34.6* 35.0*  MCV 83.8 84.3  PLT 129* 127*   Basic Metabolic Panel:  Recent Labs  16/10/96 2200 09/03/15 0503  NA 135 134*  K 5.6* 6.1*  CL 107 107  CO2 20* 18*  GLUCOSE 163* 82  BUN 35* 32*  CREATININE 1.84* 1.62*  CALCIUM 8.7* 8.4*   Liver Function Tests:  Recent Labs  09/02/15 1133  09/02/15 1300  AST 20 25  ALT 9 13*  ALKPHOS 43 45  BILITOT 0.4 0.7  PROT 6.8 6.8  ALBUMIN 3.4* 3.1*   Cardiac Enzymes:  Recent Labs  09/02/15 1802 09/02/15 2316 09/03/15 0503  TROPONINI <0.03 0.06* 0.05*    Recent Labs  09/02/15 1305  TROPIPOC 0.00   BNP:  B NATRIURETIC PEPTIDE  Date/Time Value Ref Range Status  09/02/2015 08:09 PM 193.3* 0.0 - 100.0 pg/mL Final    TELE:        ECG:    Radiology/Studies: Dg Chest Port 1 View  09/02/2015   CLINICAL DATA:  Bradycardia, shortness of breath.  EXAM: PORTABLE CHEST 1 VIEW  COMPARISON:  None.  FINDINGS: Right base atelectasis. Left lung is clear. Heart is normal size. No effusions. No acute bony abnormality.  IMPRESSION: Right base atelectasis.   Electronically Signed   By: Charlett Nose M.D.   On: 09/02/2015 14:17     Current Medications:  . aspirin  325 mg Oral Daily  . divalproex  500 mg Oral Daily  . enoxaparin (LOVENOX) injection  30 mg Subcutaneous Q24H  . ferrous sulfate  325 mg Oral BID WC  . galantamine  16 mg Oral Q breakfast  . pantoprazole  40 mg Oral Daily  .  sodium chloride  3 mL Intravenous Q12H  . terazosin  2 mg Oral QHS   . sodium chloride 10 mL/hr (09/03/15 0120)  . albuterol Stopped (09/02/15 1546)    ASSESSMENT AND PLAN: Active Problems:   Hyperkalemia - mgt per IM, peak 6.4    Bradycardia - HR 50s-70s overnight - HR dropping into the 30s on admit, all sinus, no heart block - no sx attributable to HR - no indication for pacing - not on rate-lowering meds PTA - likely 2nd hyperkalemia, follow - cards to see prn.    Elevated troponin - mild elevation felt 2nd to acute illness, no acute coronary event.  - no further evaluation needed  Otherwise, per IM. OK from cards standpoint to transfer out of CCU. From a heart rate standpoint, can go to telemetry.   Acute confusional state   Shortness of breath   Renal insufficiency   Late effects of CVA (cerebrovascular accident)    HTN   Signed, Theodore Demark , PA-C 9:24 AM 09/03/2015   History and all data above reviewed.  Patient examined.  I agree with the findings as above. He feels good.  No distress.    The patient exam reveals COR:RRR  ,  Lungs: Clear  ,  Abd: Positive bowel sounds, no rebound no guarding, Ext No edema  .  All available labs, radiology testing, previous records reviewed. Agree with documented assessment and plan. Bradycardia:  Tele reviewed.  No indication for pacing.  I asked his caregiver to bring all of his meds so that we can review.    Fayrene Fearing [facility]  9:45 AM  09/03/2015
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
## 2015-09-03 NOTE — Care Management Note (Signed)
Case Management Note  Patient Details  Name: Ronnie Hood MRN: [id number] Date of Birth: [birth year]  Subjective/Objective:         Adm w hyperkalemia           Action/Plan:lives w fam   Expected Discharge Date:                  Expected Discharge Plan:     In-House Referral:     Discharge planning Services     Post Acute Care Choice:    Choice offered to:     DME Arranged:    DME Agency:     HH Arranged:    HH Agency:     Status of Service:     Medicare Important Message Given:    Date Medicare IM Given:    Medicare IM give by:    Date Additional Medicare IM Given:    Additional Medicare Important Message give by:     If discussed at Long Length of Stay Meetings, dates discussed:    Additional Comments: ur review done  [name], RN 09/03/2015, 8:24 AM

## 2015-09-04 ENCOUNTER — Encounter (HOSPITAL_COMMUNITY): Payer: Self-pay | Admitting: Student

## 2015-09-04 LAB — BASIC METABOLIC PANEL
Anion gap: 7 (ref 5–15)
BUN: 27 mg/dL — AB (ref 6–20)
CHLORIDE: 111 mmol/L (ref 101–111)
CO2: 20 mmol/L — AB (ref 22–32)
CREATININE: 1.67 mg/dL — AB (ref 0.61–1.24)
Calcium: 8.7 mg/dL — ABNORMAL LOW (ref 8.9–10.3)
GFR calc Af Amer: 41 mL/min — ABNORMAL LOW (ref >60)
GFR calc non Af Amer: 35 mL/min — ABNORMAL LOW (ref >60)
GLUCOSE: 126 mg/dL — AB (ref 65–99)
Potassium: 4.2 mmol/L (ref 3.5–5.1)
SODIUM: 138 mmol/L (ref 135–145)

## 2015-09-04 MED ORDER — HYDROCHLOROTHIAZIDE 25 MG PO TABS
25.0000 mg | ORAL_TABLET | Freq: Every day | ORAL | Status: DC
  Administered 2015-09-04: 25 mg via ORAL
  Filled 2015-09-04: qty 1
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
## 2015-09-04 NOTE — Care Management Note (Signed)
Case Management Note  Patient Details  Name: Ronnie Hood MRN: [id number] Date of Birth: [birth year]  Subjective/Objective:           Adm w hyperkalemia         Action/Plan: lives w sister [name]. Has nse that comes out 2hrs per day this is [place] by [employer] benefits. nse jeff has been by to see pt today.   Expected Discharge Date:                  Expected Discharge Plan:  Home w Home Health Services  In-House Referral:     Discharge planning Services     Post Acute Care Choice:    Choice offered to:     DME Arranged:    DME Agency:     HH Arranged:  Nurse's Aide HH Agency:  Other - See comment  Status of Service:     Medicare Important Message Given:    Date Medicare IM Given:    Medicare IM give by:    Date Additional Medicare IM Given:    Additional Medicare Important Message give by:     If discussed at Long Length of Stay Meetings, dates discussed:    Additional Comments:spoke w sister ms meachum and she thinks agency is home instead but not sure of agency and pt does not know agency name either. Home aid serv will cont at disch.  [name], RN 09/04/2015, 11:14 AM

## 2015-09-04 NOTE — Progress Notes (Signed)
    SUBJECTIVE:  "I feel good."     PHYSICAL EXAM Filed Vitals:   09/04/15 0500 09/04/15 0600 09/04/15 0700 09/04/15 0745  BP: 149/60 145/54 128/41   Pulse: 43 44 43   Temp:    97.9 F (36.6 C)  TempSrc:    Oral  Resp: Height:      Weight:      SpO2: 98% 97% 96%    General:  No distress Lungs:  Clear Heart:  RRR Abdomen:  Positive bowel sounds, no rebound no guarding Extremities:  No edema  LABS: Lab Results  Component Value Date   TROPONINI 0.05* 09/03/2015   Results for orders placed or performed during the hospital encounter of 09/02/15 (from the past 24 hour(s))  Basic metabolic panel     Status: Abnormal   Collection Time: 09/03/15  1:15 PM  Result Value Ref Range   Sodium 137 135 - 145 mmol/L   Potassium 4.9 3.5 - 5.1 mmol/L   Chloride 109 101 - 111 mmol/L   CO2 22 22 - 32 mmol/L   Glucose, Bld 118 (H) 65 - 99 mg/dL   BUN 28 (H) 6 - 20 mg/dL   Creatinine, Ser 1.61 (H) 0.61 - 1.24 mg/dL   Calcium 8.9 8.9 - 09.6 mg/dL   GFR calc non Af Amer 35 (L) >60 mL/min   GFR calc Af Amer 41 (L) >60 mL/min   Anion gap 6 5 - 15  Basic metabolic panel     Status: Abnormal   Collection Time: 09/04/15  2:16 AM  Result Value Ref Range   Sodium 138 135 - 145 mmol/L   Potassium 4.2 3.5 - 5.1 mmol/L   Chloride 111 101 - 111 mmol/L   CO2 20 (L) 22 - 32 mmol/L   Glucose, Bld 126 (H) 65 - 99 mg/dL   BUN 27 (H) 6 - 20 mg/dL   Creatinine, Ser 0.45 (H) 0.61 - 1.24 mg/dL   Calcium 8.7 (L) 8.9 - 10.3 mg/dL   GFR calc non Af Amer 35 (L) >60 mL/min   GFR calc Af Amer 41 (L) >60 mL/min   Anion gap 7 5 - 15    Intake/Output Summary (Last 24 hours) at 09/04/15 0751 Last data filed at 09/04/15 0600  Gross per 24 hour  Intake      0 ml  Output    800 ml  Net   -800 ml    EKG:   Sinus bradycardia, rate 48, LVH with repolarization.   ASSESSMENT AND PLAN:  Hyperkalemia:  Resolved.  I reviewed his meds and he had no potassium.  There was an empty ARB bottle.     Bradycardia:  Tele reviewed.  Sinus and sinus bradycardia.  Artifact.  OK to send home we can see him back and check a 48 hour Holter at some point.  No indication for pacing at present.  Fayrene Fearing [facility] 09/04/2015 7:51 AM

## 2015-09-04 NOTE — Discharge Instructions (Signed)
You were admitted to the Family Medicine Service at [facility] from 9/28-9/30/2016. You were evaluated for shortness of breath and were found to have a high potassium level and slow heart rate. We gave you medications that made your potassium return to normal. We also found that two of your medications, losartan and triamterene-HCTZ, may have made your potassium high, so we would recommend stopping these two medications. You were also found to have changes on your EKG which may indicate a heart condition for which we would like you to see a Cardiologist at the [place]. [facility] to set up an appointment with Dr. Alphonsa Gin as well as a Cardiologist to follow up on your abnormal EKG.  In summary please: 1. STOP: losartan and triamterene-HCTZ 2. FOLLOW UP: with Dr. Alphonsa Gin at the [facility]  If you have chest pain, shortness of breath, or increased dizziness/lightheadedness, please seek immediate medical attention at your nearest Emergency Department or call 911.    Bradycardia Bradycardia is a term for a heart rate (pulse) that, in adults, is slower than 60 beats per minute. A normal rate is 60 to 100 beats per minute. A heart rate below 60 beats per minute may be normal for some adults with healthy hearts. If the rate is too slow, the heart may have trouble pumping the volume of blood the body needs. If the heart rate gets too low, blood flow to the brain may be decreased and may make you feel lightheaded, dizzy, or faint. The heart has a natural pacemaker in the top of the heart called the SA node (sinoatrial or sinus node). This pacemaker sends out regular electrical signals to the muscle of the heart, telling the heart muscle when to beat (contract). The electrical signal travels from the upper parts of the heart (atria) through the AV node (atrioventricular node), to the lower chambers of the heart (ventricles). The ventricles squeeze, pumping the blood from your heart to your  lungs and to the rest of your body. CAUSES   Problem with the heart's electrical system.  Problem with the heart's natural pacemaker.  Heart disease, damage, or infection.  Medications.  Problems with minerals and salts (electrolytes). SYMPTOMS   Fainting (syncope).  Fatigue and weakness.  Shortness of breath (dyspnea).  Chest pain (angina).  Drowsiness.  Confusion. DIAGNOSIS   An electrocardiogram (ECG) can help your caregiver determine the type of slow heart rate you have.  If the cause is not seen on an ECG, you may need to wear a heart monitor that records your heart rhythm for several hours or days.  Blood tests. TREATMENT   Electrolyte supplements.  Medications.  Withholding medication which is causing a slow heart rate.  Pacemaker placement. SEEK IMMEDIATE MEDICAL CARE IF:   You feel lightheaded or faint.  You develop an irregular heart rate.  You feel chest pain or have trouble breathing. MAKE SURE YOU:   Understand these instructions.  Will watch your condition.  Will get help right away if you are not doing well or get worse. Document Released: 08/13/2002 Document Revised: 02/13/2012 Document Reviewed: 02/26/2014 [facility] Patient Information 2015 [place], [place]. This information is not intended to replace advice given to you by your health care provider. Make sure you discuss any questions you have with your health care provider.  Hyperkalemia Hyperkalemia is when you have too much potassium in your blood. This can be a life-threatening condition. Potassium is normally removed (excreted) from the body  by the kidneys. CAUSES  The potassium level in your body can become too high for the following reasons:  You take in too much potassium. You can do this by:  Using salt substitutes. They contain large amounts of potassium.  Taking potassium supplements from your caregiver. The dose may be too high for you.  Eating foods or taking  nutritional products with potassium.  You excrete too little potassium. This can happen if:  Your kidneys are not functioning properly. Kidney (renal) disease is a very common cause of hyperkalemia.  You are taking medicines that lower your excretion of potassium, such as certain diuretic medicines.  You have an adrenal gland disease called Addison's disease.  You have a urinary tract obstruction, such as kidney stones.  You are on treatment to mechanically clean your blood (dialysis) and you skip a treatment.  You release a high amount of potassium from your cells into your blood. You may have a condition that causes potassium to move from your cells to your bloodstream. This can happen with:  Injury to muscles or other tissues. Most potassium is stored in the muscles.  Severe burns or infections.  Acidic blood plasma (acidosis). Acidosis can result from many diseases, such as uncontrolled diabetes. SYMPTOMS  Usually, there are no symptoms unless the potassium is dangerously high or has risen very quickly. Symptoms may include:  Irregular or very slow heartbeat.  Feeling sick to your stomach (nauseous).  Tiredness (fatigue).  Nerve problems such as tingling of the skin, numbness of the hands or feet, weakness, or paralysis. DIAGNOSIS  A simple blood test can measure the amount of potassium in your body. An electrocardiogram test of the heart can also help make the diagnosis. The heart may beat dangerously fast or slow down and stop beating with severe hyperkalemia.  TREATMENT  Treatment depends on how bad the condition is and on the underlying cause.  If the hyperkalemia is an emergency (causing heart problems or paralysis), many different medicines can be used alone or together to lower the potassium level briefly. This may include an insulin injection even if you are not diabetic. Emergency dialysis may be needed to remove potassium from the body.  If the hyperkalemia is less  severe or dangerous, the underlying cause is treated. This can include taking medicines if needed. Your prescription medicines may be changed. You may also need to take a medicine to help your body get rid of potassium. You may need to eat a diet low in potassium. HOME CARE INSTRUCTIONS   Take medicines and supplements as directed by your caregiver.  Do not take any over-the-counter medicines, supplements, natural products, herbs, or vitamins without reviewing them with your caregiver. Certain supplements and natural food products can have high amounts of potassium. Other products (such as ibuprofen) can damage weak kidneys and raise your potassium.  You may be asked to do repeat lab tests. Be sure to follow these directions.  If you have kidney disease, you may need to follow a low potassium diet. SEEK MEDICAL CARE IF:   You notice an irregular or very slow heartbeat.  You feel lightheaded.  You develop weakness that is unusual for you. SEEK IMMEDIATE MEDICAL CARE IF:   You have shortness of breath.  You have chest discomfort.  You pass out (faint). MAKE SURE YOU:   Understand these instructions.  Will watch your condition.  Will get help right away if you are not doing well or get worse. Document Released: 11/11/2002  Document Revised: 02/13/2012 Document Reviewed: 02/26/2014 [facility] Patient Information 2015 [place], [place]. This information is not intended to replace advice given to you by your health care provider. Make sure you discuss any questions you have with your health care provider.

## 2015-09-04 NOTE — Progress Notes (Signed)
Discharge instructions given to pt at this time. IV removed, condom catheter removed, pt dressed at bedside. NT wheeled pt to car.

## 2015-09-17 ENCOUNTER — Ambulatory Visit (INDEPENDENT_AMBULATORY_CARE_PROVIDER_SITE_OTHER): Payer: Medicare Other | Admitting: *Deleted

## 2015-09-17 DIAGNOSIS — Z23 Encounter for immunization: Secondary | ICD-10-CM | POA: Diagnosis not present

## 2015-09-25 ENCOUNTER — Telehealth: Payer: Self-pay | Admitting: Family Medicine

## 2015-09-25 NOTE — Telephone Encounter (Signed)
PATIENTS CAREGIVER IS GOING TO CALL BACK TO SCHEDULE APPOINTMENT WITH MANI TO CLOSE GAPS  FALLS./ PREVNAR / AND SCREENING FOR DEPRESSION.

## 2015-12-05 IMAGING — DX DG CHEST 1V PORT
1 series · 1 of 1 positions shown · non-contrast
Comparison: None.

CLINICAL DATA: Bradycardia, shortness of breath.

EXAM:
PORTABLE CHEST 1 VIEW

[chest ap]
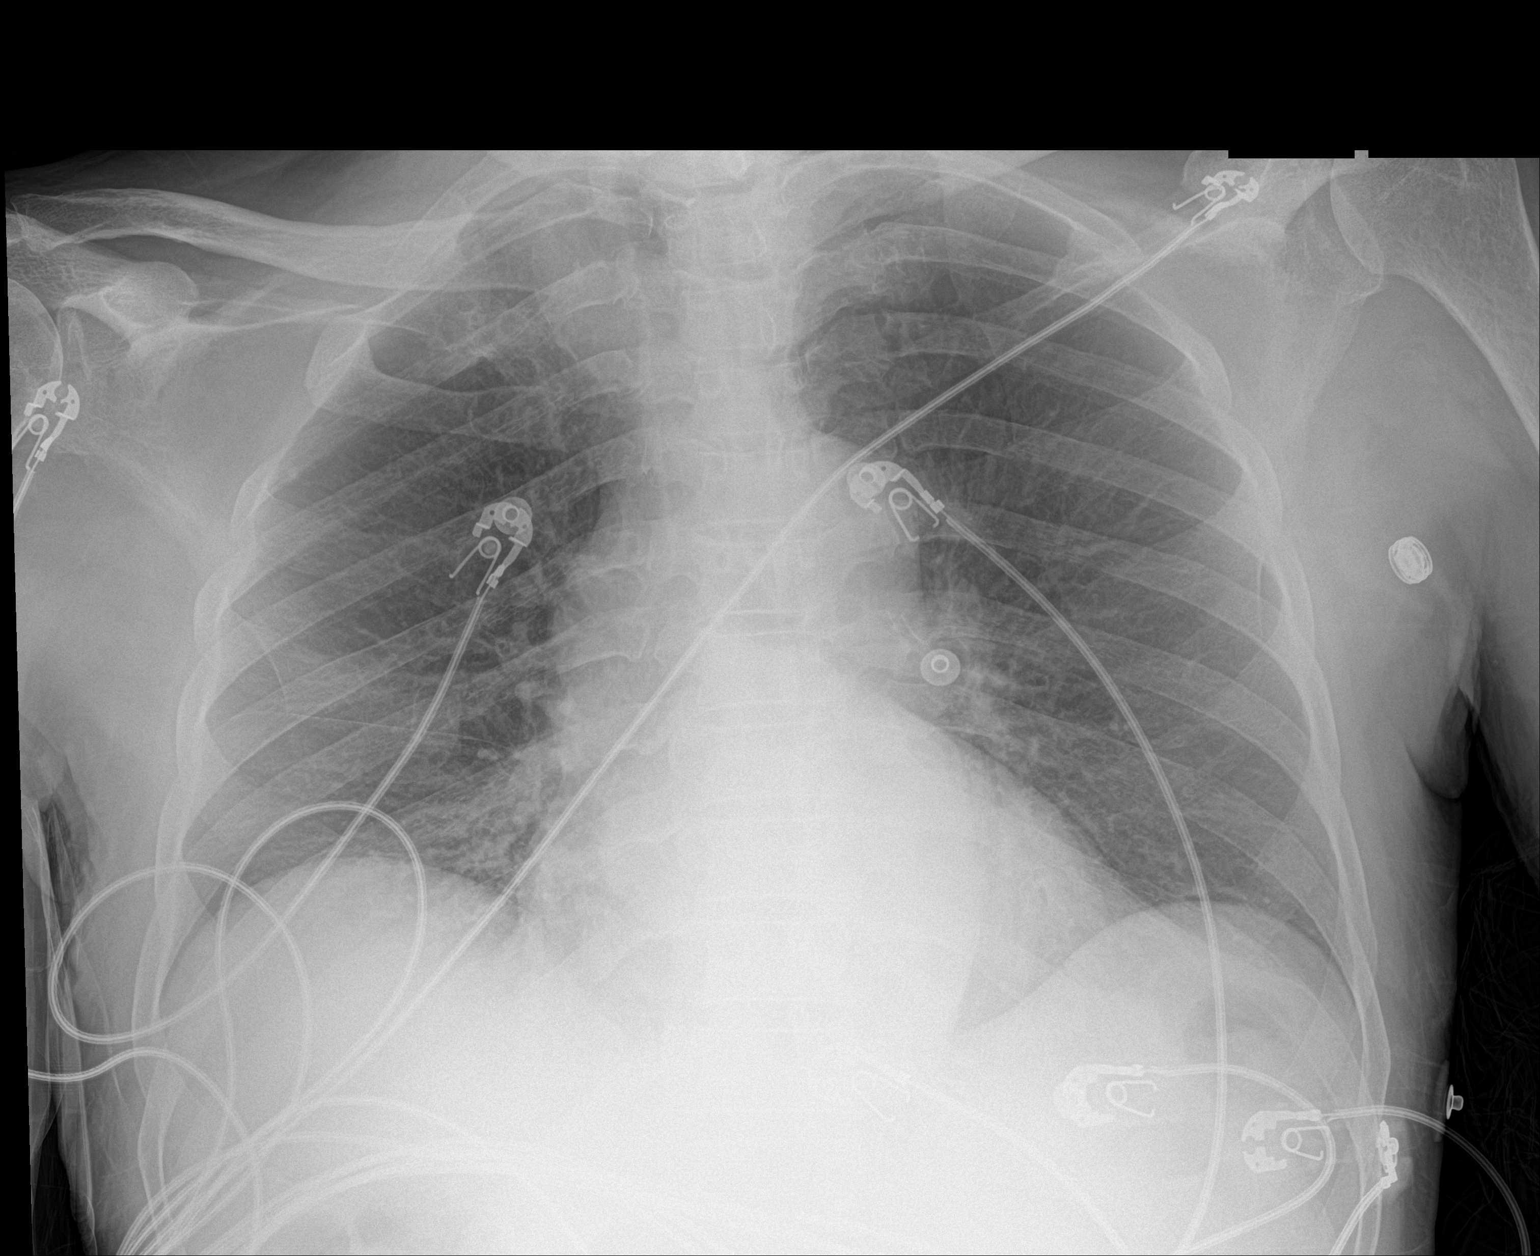

[1 of 1 positions shown; findings below may reference images not displayed]

FINDINGS: Right base atelectasis. Left lung is clear. Heart is normal size. No
effusions. No acute bony abnormality.
IMPRESSION: Right base atelectasis.
